# Patient Record
Sex: Female | Born: 1995 | Race: Black or African American | Hispanic: No | Marital: Single | Smoking: Never smoker
Health system: Southern US, Community
[De-identification: ages and names within clinical notes are randomized; demographics above are authoritative.]

## PROBLEM LIST (undated history)

## (undated) DIAGNOSIS — J45909 Unspecified asthma, uncomplicated: Secondary | ICD-10-CM

## (undated) DIAGNOSIS — R000 Tachycardia, unspecified: Secondary | ICD-10-CM

## (undated) DIAGNOSIS — J069 Acute upper respiratory infection, unspecified: Secondary | ICD-10-CM

## (undated) DIAGNOSIS — L309 Dermatitis, unspecified: Secondary | ICD-10-CM

## (undated) DIAGNOSIS — L509 Urticaria, unspecified: Secondary | ICD-10-CM

## (undated) HISTORY — DX: Acute upper respiratory infection, unspecified: J06.9

## (undated) HISTORY — PX: NO PAST SURGERIES: SHX2092

## (undated) HISTORY — DX: Dermatitis, unspecified: L30.9

## (undated) HISTORY — DX: Urticaria, unspecified: L50.9

---

## 2016-10-22 ENCOUNTER — Encounter (HOSPITAL_COMMUNITY): Payer: Self-pay | Admitting: Nurse Practitioner

## 2016-10-22 ENCOUNTER — Emergency Department (HOSPITAL_COMMUNITY): Payer: BC Managed Care – PPO

## 2016-10-22 ENCOUNTER — Emergency Department (HOSPITAL_COMMUNITY)
Admission: EM | Admit: 2016-10-22 | Discharge: 2016-10-23 | Disposition: A | Discharge status: Home / Self Care | Payer: BC Managed Care – PPO | Attending: Emergency Medicine | Admitting: Emergency Medicine

## 2016-10-22 DIAGNOSIS — R0600 Dyspnea, unspecified: Secondary | ICD-10-CM | POA: Diagnosis not present

## 2016-10-22 DIAGNOSIS — J45909 Unspecified asthma, uncomplicated: Secondary | ICD-10-CM | POA: Diagnosis not present

## 2016-10-22 DIAGNOSIS — R0602 Shortness of breath: Secondary | ICD-10-CM

## 2016-10-22 DIAGNOSIS — R079 Chest pain, unspecified: Secondary | ICD-10-CM | POA: Diagnosis not present

## 2016-10-22 HISTORY — DX: Unspecified asthma, uncomplicated: J45.909

## 2016-10-22 LAB — I-STAT CHEM 8, ED
BUN: 8 mg/dL (ref 6–20)
CREATININE: 0.8 mg/dL (ref 0.44–1.00)
Calcium, Ion: 1.16 mmol/L (ref 1.15–1.40)
Chloride: 107 mmol/L (ref 101–111)
GLUCOSE: 120 mg/dL — AB (ref 65–99)
HCT: 41 % (ref 36.0–46.0)
HEMOGLOBIN: 13.9 g/dL (ref 12.0–15.0)
POTASSIUM: 2.7 mmol/L — AB (ref 3.5–5.1)
Sodium: 142 mmol/L (ref 135–145)
TCO2: 19 mmol/L (ref 0–100)

## 2016-10-22 LAB — I-STAT BETA HCG BLOOD, ED (MC, WL, AP ONLY): I-stat hCG, quantitative: <5 m[IU]/mL (ref <5)

## 2016-10-22 MED ORDER — PREDNISONE 20 MG PO TABS
60.0000 mg | ORAL_TABLET | Freq: Once | ORAL | Status: AC
  Administered 2016-10-22: 60 mg via ORAL
  Filled 2016-10-22: qty 3

## 2016-10-22 MED ORDER — ALBUTEROL SULFATE (2.5 MG/3ML) 0.083% IN NEBU
5.0000 mg | INHALATION_SOLUTION | Freq: Once | RESPIRATORY_TRACT | Status: AC
  Administered 2016-10-22: 5 mg via RESPIRATORY_TRACT

## 2016-10-22 MED ORDER — FENTANYL CITRATE (PF) 100 MCG/2ML IJ SOLN
50.0000 ug | Freq: Once | INTRAMUSCULAR | Status: AC
  Administered 2016-10-22: 50 ug via INTRAVENOUS
  Filled 2016-10-22: qty 2

## 2016-10-22 NOTE — ED Provider Notes (Signed)
[facility]-EMERGENCY DEPT Provider Note   CSN: [id number] Arrival date & time: 10/22/16  2325   By signing my name below, I, Teofilo Pod, attest that this documentation has been prepared under the direction and in the presence of Zadie Rhine, MD . Electronically Signed: Teofilo Pod, ED Scribe. 10/22/2016. 11:54 PM.   History   Chief Complaint Chief Complaint  Patient presents with  . Shortness of Breath    The history is provided by the patient. No language interpreter was used.  Shortness of Breath  This is a new problem. The average episode lasts 1 hour. The problem occurs continuously.The current episode started less than 1 hour ago. The problem has not changed since onset.Associated symptoms include cough and chest pain. Pertinent negatives include no fever. It is unknown what precipitated the problem. Treatments tried: Albuterol inhaler. The treatment provided mild relief. Associated medical issues include asthma.   HPI Comments:  Kylie Powers is a 21 y.o. female who presents to the Emergency Department, here due to an asthma exacerbation that began 45 minutes PTA. Pt reports that she was driving in her car and went home and she began having chest tightness and SOB suddenly. Pt describes the chest pain as "like a weight on her chest." She states that the pain is worse when laying down. Pt complains of associated cough, and diaphoresis. Pt states that she has never had a similar asthma exacerbation before. Pt was using her albuterol inhaler but has run out. Pt was given a breathing treatment at the ED with significant relief. She states that she was otherwise healthy before the episode. Pt takes the birth control pill, does not smoke, and denies hx of cardiac issues. Pt denies fever.   Past Medical History:  Diagnosis Date  . Asthma     There are no active problems to display for this patient.   History reviewed. No pertinent surgical history.  OB History    Gravida Para Term Preterm AB Living   0 0 0 0 0 0   SAB TAB Ectopic Multiple Live Births   0 0 0 0 0       Home Medications    Prior to Admission medications   Medication Sig Start Date End Date Taking? Authorizing Provider  ASHLYNA 0.15-0.03 &0.01 MG tablet Take 1 tablet by mouth daily. 09/07/16  Yes Historical Provider, MD  TAZORAC 0.05 % gel Apply 1 application topically at bedtime. 10/03/16  Yes Historical Provider, MD    Family History History reviewed. No pertinent family history.  Social History Social History  Substance Use Topics  . Smoking status: Never Smoker  . Smokeless tobacco: Never Used  . Alcohol use No     Allergies   Sulfa antibiotics   Review of Systems Review of Systems  Constitutional: Negative for fever.  Respiratory: Positive for cough and shortness of breath.   Cardiovascular: Positive for chest pain.  All other systems reviewed and are negative.    Physical Exam Updated Vital Signs BP (!) 150/101 (BP Location: Left Arm)   Pulse (!) 142   Temp 98.4 F (36.9 C) (Oral)   Resp 19   Ht 5\' 2"  (1.575 m)   Wt 155 lb (70.3 kg)   SpO2 100%   BMI 28.35 kg/m   Physical Exam CONSTITUTIONAL: Well developed/well nourished; anxious HEAD: Normocephalic/atraumatic EYES: EOMI/PERRL ENMT: Mucous membranes moist; uvula midline no erythema or exudates NECK: supple no meningeal signs SPINE/BACK:entire spine nontender CV: S1/S2 noted, no murmurs/rubs/gallops noted;  tachycardic  LUNGS: tachypnea and deceased breath sounds ABDOMEN: soft, nontender, no rebound or guarding, bowel sounds noted throughout abdomen GU:no cva tenderness NEURO: Pt is awake/alert/appropriate, moves all extremitiesx4.  No facial droop.   EXTREMITIES: pulses normal/equal, full ROM SKIN: warm, color normal PSYCH: Anxious   ED Treatments / Results  DIAGNOSTIC STUDIES:  Oxygen Saturation is 100% on RA, normal by my interpretation.    COORDINATION OF CARE:  11:54 PM  Discussed treatment plan with pt at bedside and pt agreed to plan.   Labs (all labs ordered are listed, but only abnormal results are displayed) Labs Reviewed  D-DIMER, QUANTITATIVE (NOT AT [facility]) - Abnormal; Notable for the following:       Result Value   D-Dimer, Quant 0.62 (*)    All other components within normal limits  I-STAT CHEM 8, ED - Abnormal; Notable for the following:    Potassium 2.7 (*)    Glucose, Bld 120 (*)    All other components within normal limits  TROPONIN I  I-STAT BETA HCG BLOOD, ED ([facility], [facility], [facility] ONLY)    EKG  EKG Interpretation  Date/Time:  Sunday October 22 2016 23:30:42 EST Ventricular Rate:  142 PR Interval:    QRS Duration: 92 QT Interval:  296 QTC Calculation: 455 R Axis:   97 Text Interpretation:  narrow complex tachycardia Sinus tachycardia Baseline wander Artifact Abnormal ekg Confirmed by Gerhard MunchLOCKWOOD, ROBERT  MD ([id number]) on 10/22/2016 11:47:34 PM       Radiology Dg Chest 2 View  Result Date: 10/23/2016 CLINICAL DATA:  Shortness of breath. EXAM: CHEST  2 VIEW COMPARISON:  None. FINDINGS: The cardiomediastinal contours are normal. Mild bronchial thickening. Pulmonary vasculature is normal. No consolidation, pleural effusion, or pneumothorax. No acute osseous abnormalities are seen. Mild scoliosis of the thoracolumbar spine. IMPRESSION: Mild bronchial thickening, may reflect bronchitis or asthma. Electronically Signed   By: Rubye OaksMelanie  Ehinger M.D.   On: 10/23/2016 00:32   Ct Angio Chest Pe W And/or Wo Contrast  Result Date: 10/23/2016 CLINICAL DATA:  Chest tightness and dyspnea, positional. EXAM: CT ANGIOGRAPHY CHEST WITH CONTRAST TECHNIQUE: Multidetector CT imaging of the chest was performed using the standard protocol during bolus administration of intravenous contrast. Multiplanar CT image reconstructions and MIPs were obtained to evaluate the vascular anatomy. CONTRAST:  100 mL Isovue 370 intravenous. COMPARISON:  None. FINDINGS: Cardiovascular:  Satisfactory opacification of the pulmonary arteries to the segmental level. No evidence of pulmonary embolism. Normal heart size. No pericardial effusion. Mediastinum/Nodes: No enlarged mediastinal, hilar, or axillary lymph nodes. Thyroid gland, trachea, and esophagus demonstrate no significant findings. Lungs/Pleura: Lungs are clear. No pleural effusion or pneumothorax. Upper Abdomen: No acute abnormality. Musculoskeletal: No chest wall abnormality. No acute or significant osseous findings. Review of the MIP images confirms the above findings. IMPRESSION: Negative for acute pulmonary embolism.  No significant abnormality. Electronically Signed   By: Ellery Plunkaniel R Mitchell M.D.   On: 10/23/2016 03:32    Procedures Procedures (including critical care time)  Medications Ordered in ED Medications  iopamidol (ISOVUE-370) 76 % injection (not administered)  sodium chloride 0.9 % injection (not administered)  albuterol (PROVENTIL) (2.5 MG/3ML) 0.083% nebulizer solution 5 mg (5 mg Nebulization Given 10/22/16 2342)  predniSONE (DELTASONE) tablet 60 mg (60 mg Oral Given 10/22/16 2353)  fentaNYL (SUBLIMAZE) injection 50 mcg (50 mcg Intravenous Given 10/22/16 2358)  potassium chloride SA (K-DUR,KLOR-CON) CR tablet 40 mEq (40 mEq Oral Given 10/23/16 0029)  fentaNYL (SUBLIMAZE) injection 50 mcg (50 mcg Intravenous Given 10/23/16  1610)  potassium chloride SA (K-DUR,KLOR-CON) CR tablet 40 mEq (40 mEq Oral Given 10/23/16 0158)  ketorolac (TORADOL) 15 MG/ML injection 15 mg (15 mg Intravenous Given 10/23/16 0300)  iopamidol (ISOVUE-370) 76 % injection 100 mL (100 mLs Intravenous Contrast Given 10/23/16 0305)  sodium chloride 0.9 % bolus 1,000 mL (0 mLs Intravenous Stopped 10/23/16 0541)     Initial Impression / Assessment and Plan / ED Course  I have reviewed the triage vital signs and the nursing notes.  Pertinent labs & imaging results that were available during my care of the patient were reviewed by me and considered  in my medical decision making (see chart for details).     5:59 AM Pt seen for CP/SOB Sh was initially tachycardic/tachypneic/ill appearing Extensive workup unrevealing as I was concerned for PE Pt improved HR 114 She is well appearing/watching TV I doubt ACS No signs of pericarditis/myocarditis Will d/c home We discussed return precautions  Final Clinical Impressions(s) / ED Diagnoses   Final diagnoses:  Chest pain, unspecified type  Dyspnea, unspecified type    New Prescriptions New Prescriptions   ALBUTEROL (PROVENTIL HFA;VENTOLIN HFA) 108 (90 BASE) MCG/ACT INHALER    Inhale 2 puffs into the lungs every 2 (two) hours as needed for wheezing or shortness of breath (cough).  I personally performed the services described in this documentation, which was scribed in my presence. The recorded information has been reviewed and is accurate.        Zadie Rhine, MD 10/23/16 [phone]

## 2016-10-22 NOTE — ED Triage Notes (Signed)
Patient states she started having an asthma attach about 45 min ago. States she has been using her albuterol inhaler but is out. HR is 148. She has labored breathing. Diaphoretic. Inhaler that she has with her is empty.

## 2016-10-23 ENCOUNTER — Emergency Department (HOSPITAL_COMMUNITY): Payer: BC Managed Care – PPO

## 2016-10-23 ENCOUNTER — Encounter (HOSPITAL_COMMUNITY): Payer: Self-pay

## 2016-10-23 LAB — TROPONIN I

## 2016-10-23 LAB — D-DIMER, QUANTITATIVE: D-Dimer, Quant: 0.62 ug/mL-FEU — ABNORMAL HIGH (ref 0.00–0.50)

## 2016-10-23 MED ORDER — SODIUM CHLORIDE 0.9 % IV BOLUS (SEPSIS)
1000.0000 mL | Freq: Once | INTRAVENOUS | Status: AC
  Administered 2016-10-23: 1000 mL via INTRAVENOUS

## 2016-10-23 MED ORDER — IOPAMIDOL (ISOVUE-370) INJECTION 76%
100.0000 mL | Freq: Once | INTRAVENOUS | Status: AC | PRN
  Administered 2016-10-23: 100 mL via INTRAVENOUS

## 2016-10-23 MED ORDER — FENTANYL CITRATE (PF) 100 MCG/2ML IJ SOLN
50.0000 ug | Freq: Once | INTRAMUSCULAR | Status: AC
  Administered 2016-10-23: 50 ug via INTRAVENOUS
  Filled 2016-10-23: qty 2

## 2016-10-23 MED ORDER — KETOROLAC TROMETHAMINE 15 MG/ML IJ SOLN
15.0000 mg | Freq: Once | INTRAMUSCULAR | Status: AC
  Administered 2016-10-23: 15 mg via INTRAVENOUS
  Filled 2016-10-23: qty 1

## 2016-10-23 MED ORDER — IOPAMIDOL (ISOVUE-370) INJECTION 76%
INTRAVENOUS | Status: AC
  Filled 2016-10-23: qty 100

## 2016-10-23 MED ORDER — ALBUTEROL SULFATE HFA 108 (90 BASE) MCG/ACT IN AERS: 2.0000 | INHALATION_SPRAY | RESPIRATORY_TRACT | 0 refills | Status: DC | PRN

## 2016-10-23 MED ORDER — SODIUM CHLORIDE 0.9 % IJ SOLN
INTRAMUSCULAR | Status: AC
  Filled 2016-10-23: qty 50

## 2016-10-23 MED ORDER — POTASSIUM CHLORIDE CRYS ER 20 MEQ PO TBCR
40.0000 meq | EXTENDED_RELEASE_TABLET | Freq: Once | ORAL | Status: AC
  Administered 2016-10-23: 40 meq via ORAL
  Filled 2016-10-23: qty 2

## 2016-10-23 NOTE — ED Notes (Signed)
Patient transported to CT 

## 2016-10-23 NOTE — Discharge Instructions (Signed)

## 2016-10-23 NOTE — ED Notes (Signed)
Patient appears to be in less distress. Talking and laughing with friend. No use of accessory muscle. Breathing is more relaxed.

## 2016-10-26 ENCOUNTER — Telehealth: Payer: Self-pay

## 2016-10-26 NOTE — Telephone Encounter (Signed)
Spoke with the nurse for [facility], I saw that MW had a 30 min. Slot on Feb 15, She will discuss with [facility] if that appointment will work and then contact us back.

## 2016-11-14 ENCOUNTER — Ambulatory Visit (HOSPITAL_COMMUNITY)
Admission: EM | Admit: 2016-11-14 | Discharge: 2016-11-14 | Disposition: A | Discharge status: Home / Self Care | Payer: BC Managed Care – PPO | Attending: Family Medicine | Admitting: Family Medicine

## 2016-11-14 ENCOUNTER — Encounter (HOSPITAL_COMMUNITY): Payer: Self-pay | Admitting: Family Medicine

## 2016-11-14 DIAGNOSIS — R0789 Other chest pain: Secondary | ICD-10-CM

## 2016-11-14 MED ORDER — DICLOFENAC POTASSIUM 50 MG PO TABS: 50.0000 mg | ORAL_TABLET | Freq: Three times a day (TID) | ORAL | 1 refills | Status: DC

## 2016-11-14 NOTE — ED Triage Notes (Signed)
Pt here with heaviness in chest that has been going on for weeks. sts she has been evaluated but today she came in because BP was low at home. Diastolic was low.

## 2016-11-14 NOTE — ED Provider Notes (Addendum)
[facility]    CSN: [id number] Arrival date & time: 11/14/16  1659     History   Chief Complaint Chief Complaint  Patient presents with  . Chest Pain    HPI Kylie Powers is a 21 y.o. female.   The history is provided by the patient and a parent.  Chest Pain  Pain location:  L chest Pain quality: sharp   Pain radiates to:  Does not radiate Pain severity:  Mild Onset quality:  Gradual Duration:  2 weeks Timing:  Intermittent Progression:  Unchanged Chronicity:  New Context: movement   Relieved by:  None tried Worsened by:  Nothing Ineffective treatments:  None tried Associated symptoms: no abdominal pain, no back pain, no fever, no palpitations, no PND and no shortness of breath     Past Medical History:  Diagnosis Date  . Asthma     There are no active problems to display for this patient.   History reviewed. No pertinent surgical history.  OB History    Gravida Para Term Preterm AB Living   0 0 0 0 0 0   SAB TAB Ectopic Multiple Live Births   0 0 0 0 0       Home Medications    Prior to Admission medications   Medication Sig Start Date End Date Taking? Authorizing Provider  albuterol (PROVENTIL HFA;VENTOLIN HFA) 108 (90 Base) MCG/ACT inhaler Inhale 2 puffs into the lungs every 2 (two) hours as needed for wheezing or shortness of breath (cough). 10/23/16   Zadie Rhineonald Wickline, MD  ASHLYNA 0.15-0.03 &0.01 MG tablet Take 1 tablet by mouth daily. 09/07/16   Historical Provider, MD  diclofenac (CATAFLAM) 50 MG tablet Take 1 tablet (50 mg total) by mouth 3 (three) times daily. 11/14/16   Linna HoffJames D Tamakia Porto, MD  TAZORAC 0.05 % gel Apply 1 application topically at bedtime. 10/03/16   Historical Provider, MD    Family History History reviewed. No pertinent family history.  Social History Social History  Substance Use Topics  . Smoking status: Never Smoker  . Smokeless tobacco: Never Used  . Alcohol use No     Allergies   Sulfa  antibiotics   Review of Systems Review of Systems  Constitutional: Negative.  Negative for fever.  HENT: Negative.   Respiratory: Negative.  Negative for shortness of breath.   Cardiovascular: Positive for chest pain. Negative for palpitations, leg swelling and PND.  Gastrointestinal: Negative.  Negative for abdominal pain.  Musculoskeletal: Negative for back pain.     Physical Exam Triage Vital Signs ED Triage Vitals  Enc Vitals Group     BP 11/14/16 1716 130/81     Pulse Rate 11/14/16 1716 84     Resp 11/14/16 1716 18     Temp 11/14/16 1716 98.8 F (37.1 C)     Temp src --      SpO2 11/14/16 1716 100 %     Weight --      Height --      Head Circumference --      Peak Flow --      Pain Score 11/14/16 1718 6     Pain Loc --      Pain Edu? --      Excl. in GC? --    No data found.   Updated Vital Signs BP 130/81   Pulse 84   Temp 98.8 F (37.1 C)   Resp 18   LMP 10/14/2016   SpO2 100%  Visual Acuity Right Eye Distance:   Left Eye Distance:   Bilateral Distance:    Right Eye Near:   Left Eye Near:    Bilateral Near:     Physical Exam  Constitutional: She is oriented to person, place, and time. She appears well-developed and well-nourished.  Eyes: Conjunctivae are normal. Pupils are equal, round, and reactive to light.  Neck: Normal range of motion. Neck supple.  Cardiovascular: Normal rate, regular rhythm, normal heart sounds and intact distal pulses.   Pulmonary/Chest: Effort normal and breath sounds normal. She exhibits tenderness.  Neurological: She is alert and oriented to person, place, and time.     UC Treatments / Results  Labs (all labs ordered are listed, but only abnormal results are displayed) Labs Reviewed - No data to display  EKG  EKG Interpretation None       Radiology No results found.  Procedures Procedures (including critical care time)  Medications Ordered in UC Medications - No data to display   Initial  Impression / Assessment and Plan / UC Course  I have reviewed the triage vital signs and the nursing notes.  Pertinent labs & imaging results that were available during my care of the patient were reviewed by me and considered in my medical decision making (see chart for details).       Final Clinical Impressions(s) / UC Diagnoses   Final diagnoses:  Chest wall pain    New Prescriptions Discharge Medication List as of 11/14/2016  6:42 PM    START taking these medications   Details  diclofenac (CATAFLAM) 50 MG tablet Take 1 tablet (50 mg total) by mouth 3 (three) times daily., Starting Tue 11/14/2016, Print         Linna Hoff, MD 11/14/16 1610    Linna Hoff, MD 11/14/16 2104

## 2016-11-14 NOTE — Discharge Instructions (Signed)
Use medicine as needed, see your doctor as needed. °

## 2017-08-21 ENCOUNTER — Encounter (HOSPITAL_BASED_OUTPATIENT_CLINIC_OR_DEPARTMENT_OTHER): Payer: Self-pay

## 2017-08-21 ENCOUNTER — Other Ambulatory Visit: Payer: Self-pay

## 2017-08-21 ENCOUNTER — Emergency Department (HOSPITAL_BASED_OUTPATIENT_CLINIC_OR_DEPARTMENT_OTHER): Payer: BC Managed Care – PPO

## 2017-08-21 ENCOUNTER — Emergency Department (HOSPITAL_BASED_OUTPATIENT_CLINIC_OR_DEPARTMENT_OTHER)
Admission: EM | Admit: 2017-08-21 | Discharge: 2017-08-22 | Disposition: A | Discharge status: Home / Self Care | Payer: BC Managed Care – PPO | Attending: Emergency Medicine | Admitting: Emergency Medicine

## 2017-08-21 DIAGNOSIS — Z79899 Other long term (current) drug therapy: Secondary | ICD-10-CM | POA: Diagnosis not present

## 2017-08-21 DIAGNOSIS — I880 Nonspecific mesenteric lymphadenitis: Secondary | ICD-10-CM | POA: Diagnosis not present

## 2017-08-21 DIAGNOSIS — R1110 Vomiting, unspecified: Secondary | ICD-10-CM | POA: Diagnosis present

## 2017-08-21 DIAGNOSIS — R112 Nausea with vomiting, unspecified: Secondary | ICD-10-CM

## 2017-08-21 DIAGNOSIS — R197 Diarrhea, unspecified: Secondary | ICD-10-CM

## 2017-08-21 DIAGNOSIS — J45909 Unspecified asthma, uncomplicated: Secondary | ICD-10-CM | POA: Insufficient documentation

## 2017-08-21 DIAGNOSIS — R1031 Right lower quadrant pain: Secondary | ICD-10-CM | POA: Insufficient documentation

## 2017-08-21 HISTORY — DX: Tachycardia, unspecified: R00.0

## 2017-08-21 LAB — URINALYSIS, ROUTINE W REFLEX MICROSCOPIC
Bilirubin Urine: NEGATIVE
GLUCOSE, UA: NEGATIVE mg/dL
Ketones, ur: NEGATIVE mg/dL
LEUKOCYTES UA: NEGATIVE
NITRITE: NEGATIVE
PH: 6 (ref 5.0–8.0)
Protein, ur: NEGATIVE mg/dL
SPECIFIC GRAVITY, URINE: 1.015 (ref 1.005–1.030)

## 2017-08-21 LAB — CBC
HEMATOCRIT: 39 % (ref 36.0–46.0)
HEMOGLOBIN: 13.3 g/dL (ref 12.0–15.0)
MCH: 25.3 pg — AB (ref 26.0–34.0)
MCHC: 34.1 g/dL (ref 30.0–36.0)
MCV: 74.3 fL — ABNORMAL LOW (ref 78.0–100.0)
Platelets: 257 10*3/uL (ref 150–400)
RBC: 5.25 MIL/uL — ABNORMAL HIGH (ref 3.87–5.11)
RDW: 13.9 % (ref 11.5–15.5)
WBC: 6.4 10*3/uL (ref 4.0–10.5)

## 2017-08-21 LAB — COMPREHENSIVE METABOLIC PANEL
ALBUMIN: 4.2 g/dL (ref 3.5–5.0)
ALK PHOS: 46 U/L (ref 38–126)
ALT: 19 U/L (ref 14–54)
ANION GAP: 6 (ref 5–15)
AST: 20 U/L (ref 15–41)
BUN: 6 mg/dL (ref 6–20)
CALCIUM: 8.4 mg/dL — AB (ref 8.9–10.3)
CO2: 24 mmol/L (ref 22–32)
Chloride: 106 mmol/L (ref 101–111)
Creatinine, Ser: 0.82 mg/dL (ref 0.44–1.00)
GFR calc Af Amer: >60 mL/min (ref >60)
GFR calc non Af Amer: >60 mL/min (ref >60)
GLUCOSE: 90 mg/dL (ref 65–99)
POTASSIUM: 3.5 mmol/L (ref 3.5–5.1)
SODIUM: 136 mmol/L (ref 135–145)
Total Bilirubin: 0.3 mg/dL (ref 0.3–1.2)
Total Protein: 7.1 g/dL (ref 6.5–8.1)

## 2017-08-21 LAB — URINALYSIS, MICROSCOPIC (REFLEX)

## 2017-08-21 LAB — LIPASE, BLOOD: Lipase: 27 U/L (ref 11–51)

## 2017-08-21 LAB — PREGNANCY, URINE: Preg Test, Ur: NEGATIVE

## 2017-08-21 MED ORDER — MORPHINE SULFATE (PF) 4 MG/ML IV SOLN
8.0000 mg | Freq: Once | INTRAVENOUS | Status: AC
  Administered 2017-08-21: 8 mg via INTRAVENOUS
  Filled 2017-08-21: qty 2

## 2017-08-21 MED ORDER — ONDANSETRON HCL 4 MG/2ML IJ SOLN
INTRAMUSCULAR | Status: AC
  Administered 2017-08-21: 4 mg via INTRAVENOUS
  Filled 2017-08-21: qty 2

## 2017-08-21 MED ORDER — IOPAMIDOL (ISOVUE-300) INJECTION 61%
100.0000 mL | Freq: Once | INTRAVENOUS | Status: AC | PRN
  Administered 2017-08-22: 100 mL via INTRAVENOUS

## 2017-08-21 MED ORDER — FENTANYL CITRATE (PF) 100 MCG/2ML IJ SOLN
50.0000 ug | Freq: Once | INTRAMUSCULAR | Status: AC
  Administered 2017-08-21: 50 ug via INTRAVENOUS
  Filled 2017-08-21: qty 2

## 2017-08-21 MED ORDER — SODIUM CHLORIDE 0.9 % IV BOLUS (SEPSIS)
1000.0000 mL | Freq: Once | INTRAVENOUS | Status: AC
  Administered 2017-08-21: 1000 mL via INTRAVENOUS

## 2017-08-21 MED ORDER — ONDANSETRON HCL 4 MG/2ML IJ SOLN
4.0000 mg | Freq: Once | INTRAMUSCULAR | Status: AC
  Administered 2017-08-21: 4 mg via INTRAVENOUS

## 2017-08-21 NOTE — ED Provider Notes (Signed)
[facility] EMERGENCY DEPARTMENT Provider Note   CSN: [id number] Arrival date & time: 08/21/17  2038     History   Chief Complaint Chief Complaint  Patient presents with  . Emesis    HPI Kylie Powers is a 21 y.o. female.  HPI  21 year old female presents with vomiting, abdominal pain, and diarrhea.  She has had on and off vomiting for the last 2 weeks.  However since 2 days ago the vomiting is more consistent and she is also having a lot of diarrhea anytime she tries to eat.  Both of these are nonbloody and the diarrhea is watery.  No recent travel outside the country or antibiotics.  No fevers.  The abdominal pain is right lower abdomen.  It feels like a cramping and is constant and worsening.  No urinary symptoms or vaginal symptoms.  Pain is an 8/10.  Past Medical History:  Diagnosis Date  . Asthma   . Sinus tachycardia     There are no active problems to display for this patient.   History reviewed. No pertinent surgical history.  OB History    Gravida Para Term Preterm AB Living   0 0 0 0 0 0   SAB TAB Ectopic Multiple Live Births   0 0 0 0 0       Home Medications    Prior to Admission medications   Medication Sig Start Date End Date Taking? Authorizing Provider  promethazine (PHENERGAN) 25 MG tablet Take 25 mg by mouth every 6 (six) hours as needed for nausea or vomiting.   Yes [provider]  albuterol (PROVENTIL HFA;VENTOLIN HFA) 108 (90 Base) MCG/ACT inhaler Inhale 2 puffs into the lungs every 2 (two) hours as needed for wheezing or shortness of breath (cough). 10/23/16   Zadie RhineWickline, Donald, MD    Family History No family history on file.  Social History Social History   Tobacco Use  . Smoking status: Never Smoker  . Smokeless tobacco: Never Used  Substance Use Topics  . Alcohol use: No  . Drug use: No     Allergies   Sulfa antibiotics   Review of Systems Review of Systems  Respiratory: Negative for shortness of  breath.   Cardiovascular: Negative for chest pain.  Gastrointestinal: Positive for abdominal pain, diarrhea, nausea and vomiting. Negative for blood in stool.  Genitourinary: Negative for dysuria and hematuria.  All other systems reviewed and are negative.    Physical Exam Updated Vital Signs BP 139/89   Pulse 100   Temp 97.6 F (36.4 C) (Oral)   Resp 18   Ht 5\' 3"  (1.6 m)   Wt 80.3 kg (177 lb 0.5 oz)   LMP 08/07/2017   SpO2 99%   BMI 31.36 kg/m   Physical Exam  Constitutional: She is oriented to person, place, and time. She appears well-developed and well-nourished. No distress.  HENT:  Head: Normocephalic and atraumatic.  Right Ear: External ear normal.  Left Ear: External ear normal.  Nose: Nose normal.  Eyes: Right eye exhibits no discharge. Left eye exhibits no discharge.  Cardiovascular: Regular rhythm and normal heart sounds. Tachycardia present.  Pulmonary/Chest: Effort normal and breath sounds normal. She has no wheezes.  Abdominal: Soft. There is tenderness in the right lower quadrant.  Neurological: She is alert and oriented to person, place, and time.  Skin: Skin is warm and dry. She is not diaphoretic.  Nursing note and vitals reviewed.    ED Treatments / Results  Labs (  all labs ordered are listed, but only abnormal results are displayed) Labs Reviewed  URINALYSIS, ROUTINE W REFLEX MICROSCOPIC - Abnormal; Notable for the following components:      Result Value   Hgb urine dipstick TRACE (*)    All other components within normal limits  URINALYSIS, MICROSCOPIC (REFLEX) - Abnormal; Notable for the following components:   Bacteria, UA MANY (*)    Squamous Epithelial / LPF 0-5 (*)    All other components within normal limits  COMPREHENSIVE METABOLIC PANEL - Abnormal; Notable for the following components:   Calcium 8.4 (*)    All other components within normal limits  CBC - Abnormal; Notable for the following components:   RBC 5.25 (*)    MCV 74.3 (*)      MCH 25.3 (*)    All other components within normal limits  PREGNANCY, URINE  LIPASE, BLOOD    EKG  EKG Interpretation None       Radiology No results found.  Procedures Procedures (including critical care time)  Medications Ordered in ED Medications  iopamidol (ISOVUE-300) 61 % injection 100 mL (not administered)  ondansetron (ZOFRAN) injection 4 mg (4 mg Intravenous Given 08/21/17 2141)  fentaNYL (SUBLIMAZE) injection 50 mcg (50 mcg Intravenous Given 08/21/17 2222)  sodium chloride 0.9 % bolus 1,000 mL (0 mLs Intravenous Stopped 08/21/17 2329)  morphine 4 MG/ML injection 8 mg (8 mg Intravenous Given 08/21/17 2330)     Initial Impression / Assessment and Plan / ED Course  I have reviewed the triage vital signs and the nursing notes.  Pertinent labs & imaging results that were available during my care of the patient were reviewed by me and considered in my medical decision making (see chart for details).     Patient's symptoms are most c/w gastroenteritis. However with significant RLQ tenderness, will get CT to r/o appendicitis. CT pending, care transferred to Dr. Read DriversMolpus. Otherwise, if CT negative, could modify anti-emetic meds and d/c home w/ PCP f/u  Final Clinical Impressions(s) / ED Diagnoses   Final diagnoses:  None    ED Discharge Orders    None       Pricilla LovelessGoldston, Krisi Azua, MD 08/22/17 0002

## 2017-08-21 NOTE — ED Triage Notes (Signed)
C/o abd pain, n/v/d x 2 weeks-NAD-steady gait

## 2017-08-21 NOTE — ED Notes (Signed)
Pt states she has been taking phenergan

## 2017-08-22 MED ORDER — ONDANSETRON 8 MG PO TBDP: 8.0000 mg | ORAL_TABLET | Freq: Three times a day (TID) | ORAL | 1 refills | Status: DC | PRN

## 2017-08-22 NOTE — ED Notes (Signed)
Pt off the unit in CT  

## 2018-08-10 IMAGING — CT CT ANGIO CHEST
3 of 7 series · 19 of 36 positions shown · IV contrast (ISOVUE 370)
Comparison: None.

CLINICAL DATA: Chest tightness and dyspnea, positional.

EXAM:
CT ANGIOGRAPHY CHEST WITH CONTRAST
TECHNIQUE: Multidetector CT imaging of the chest was performed using the
standard protocol during bolus administration of intravenous
contrast. Multiplanar CT image reconstructions and MIPs were
obtained to evaluate the vascular anatomy.
CONTRAST:  100 mL Isovue 370 intravenous.

[Series 5: thins for pacs · axial · 0.56mm/px · z∈[+1424,+1608]mm · 15 of 212 slices shown]
[im 14/212  lung]
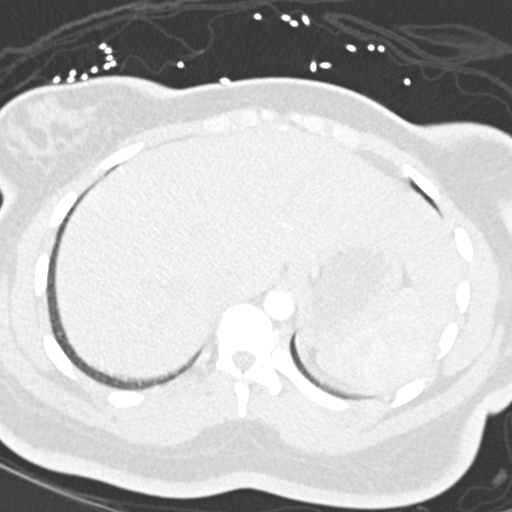
[im 27/212  mediastinal]
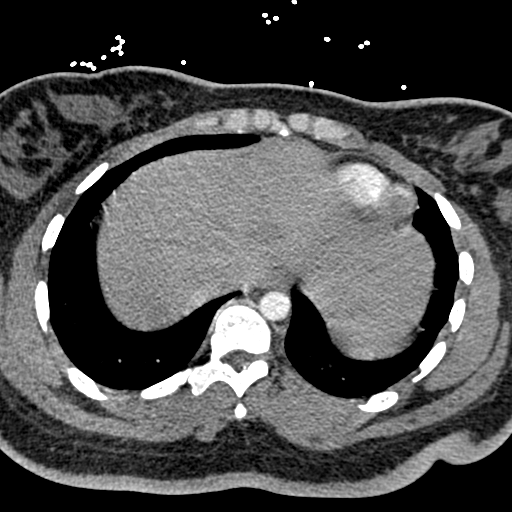
[im 40/212  lung]
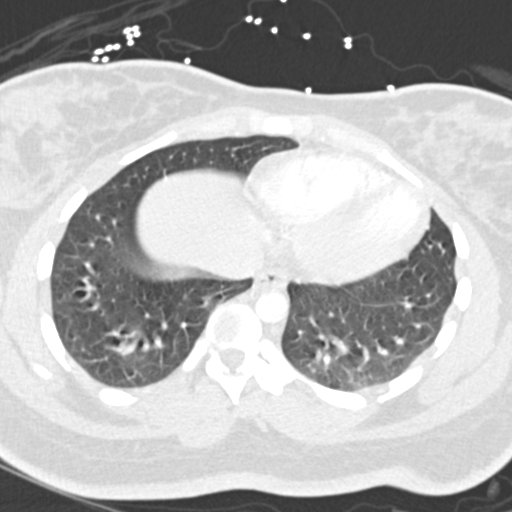
[im 53/212  mediastinal]
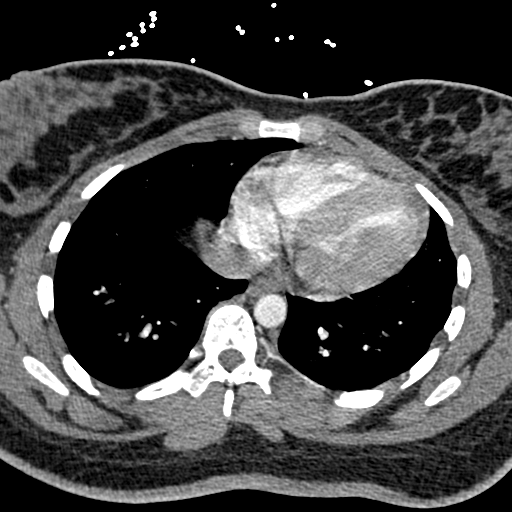
[im 66/212  lung]
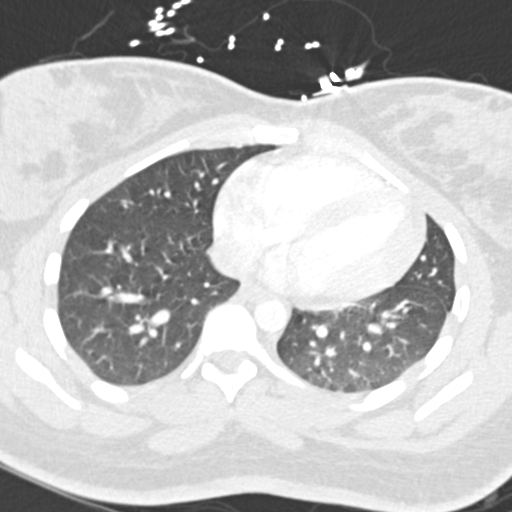
[im 80/212  mediastinal]
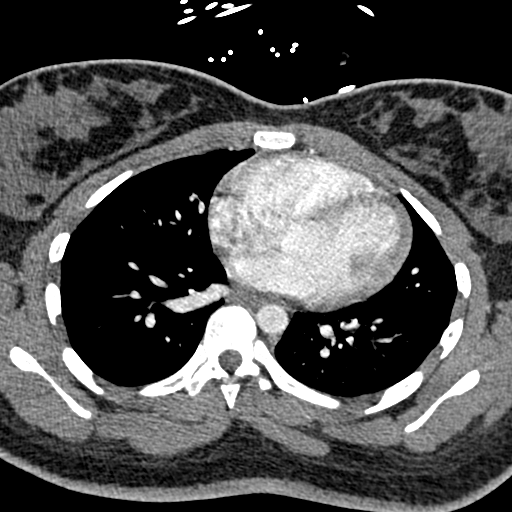
[im 93/212  lung]
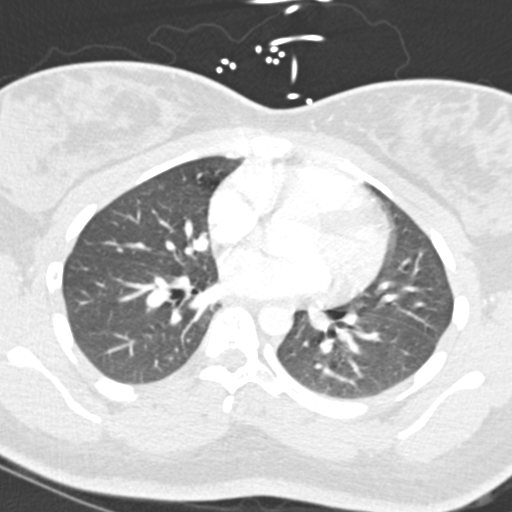
[im 106/212  mediastinal]
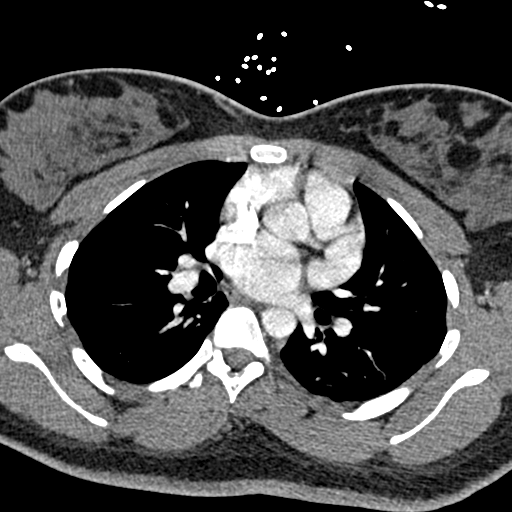
[im 119/212  lung]
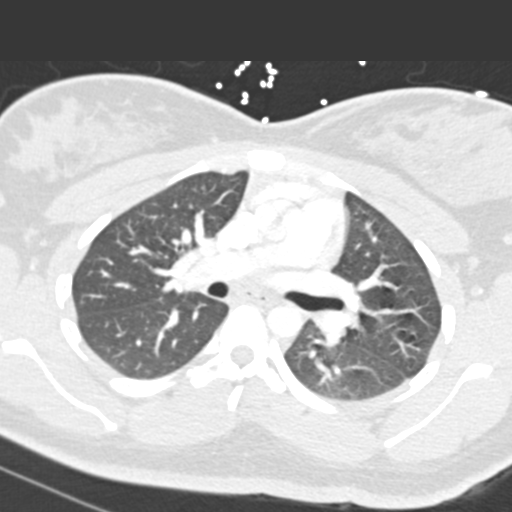
[im 132/212  mediastinal]
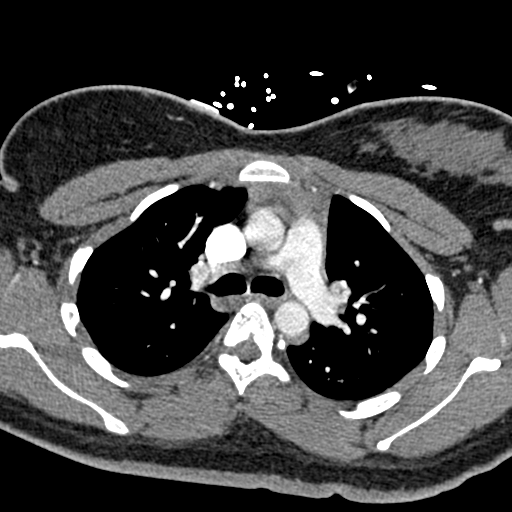
[im 146/212  lung]
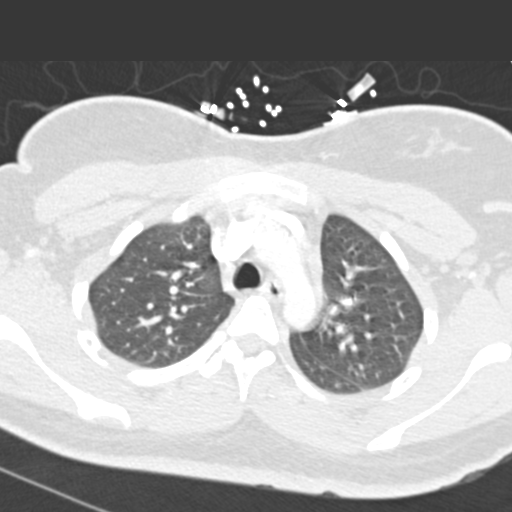
[im 159/212  mediastinal]
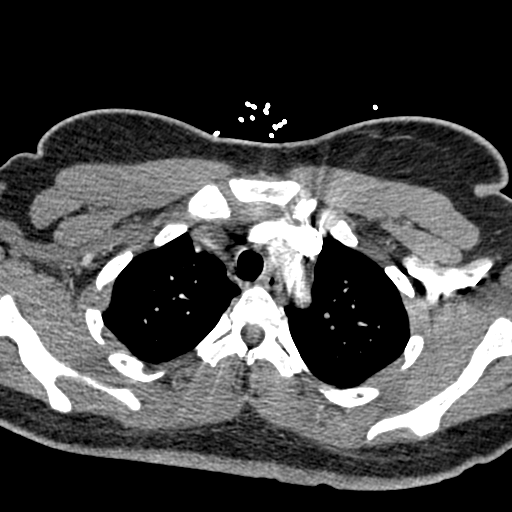
[im 172/212  lung]
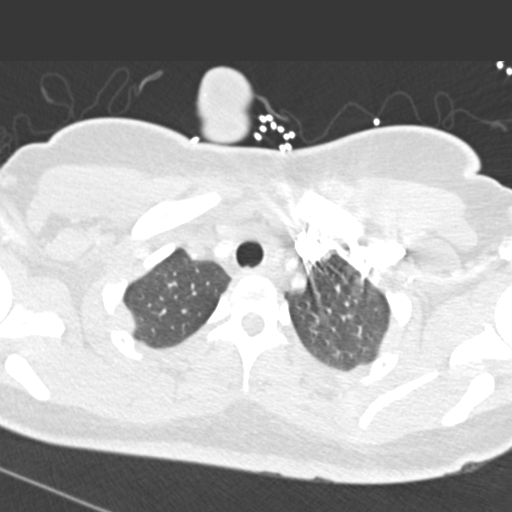
[im 185/212  mediastinal]
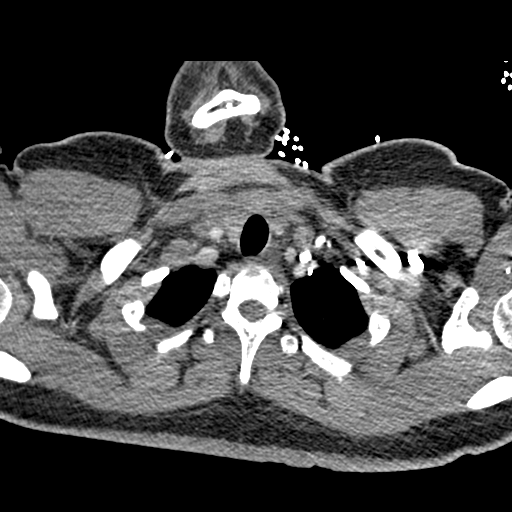
[im 198/212  lung]
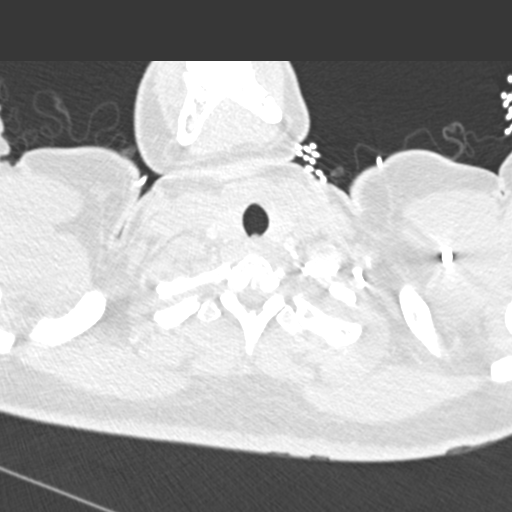

[Series 6: lung windows · axial · 0.56mm/px · z∈[+1468,+1570]mm · 3 of 69 slices shown]
[im 18/69  mediastinal]
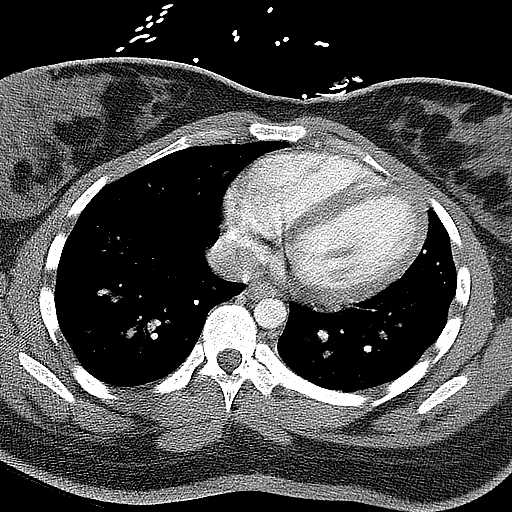
[im 35/69  mediastinal]
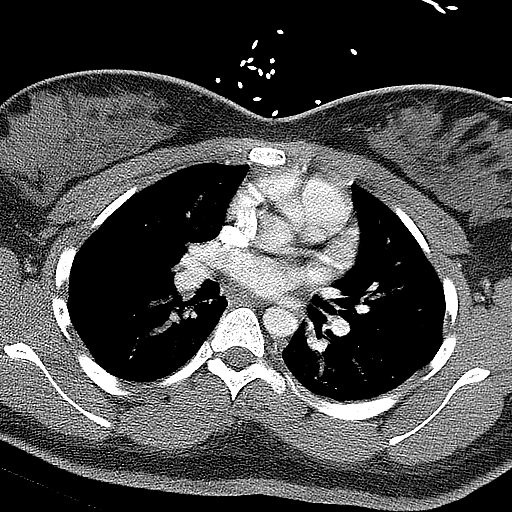
[im 52/69  mediastinal]
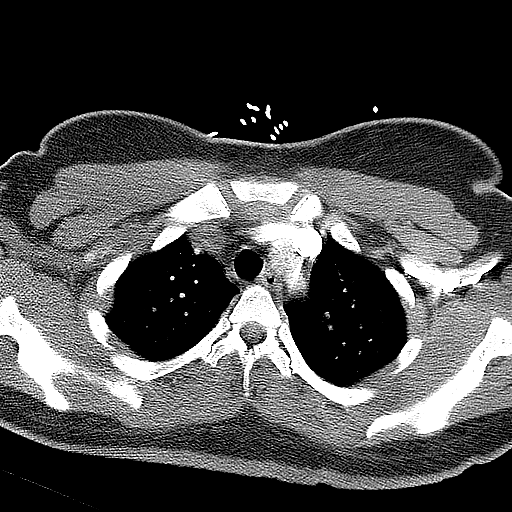

[Series 7: coronal mpr · coronal · 0.41mm/px · 1 of 106 slices shown]
[im 53/106  mediastinal]
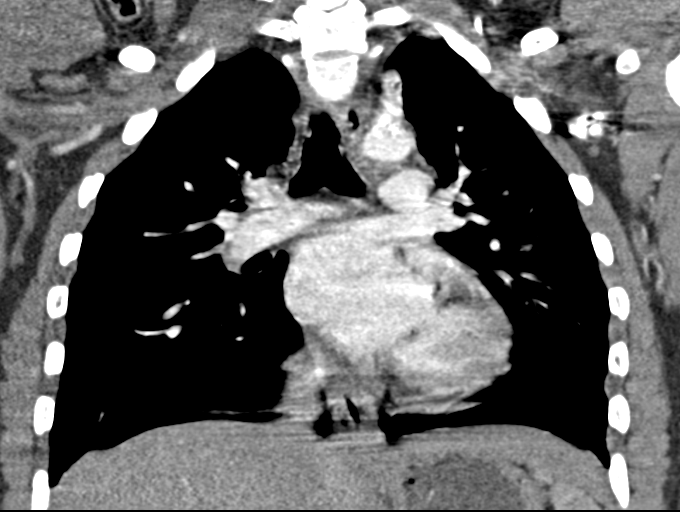

[19 of 36 positions shown; findings below may reference images not displayed]

FINDINGS: Cardiovascular: Satisfactory opacification of the pulmonary arteries
to the segmental level. No evidence of pulmonary embolism. Normal
heart size. No pericardial effusion.

Mediastinum/Nodes: No enlarged mediastinal, hilar, or axillary lymph
nodes. Thyroid gland, trachea, and esophagus demonstrate no
significant findings.

Lungs/Pleura: Lungs are clear. No pleural effusion or pneumothorax.

Upper Abdomen: No acute abnormality.

Musculoskeletal: No chest wall abnormality. No acute or significant
osseous findings.

Review of the MIP images confirms the above findings.
IMPRESSION: Negative for acute pulmonary embolism.  No significant abnormality.

## 2018-11-07 ENCOUNTER — Telehealth: Payer: Self-pay

## 2018-11-07 ENCOUNTER — Ambulatory Visit: Payer: BC Managed Care – PPO | Admitting: Allergy

## 2018-11-07 ENCOUNTER — Encounter: Payer: Self-pay | Admitting: Allergy

## 2018-11-07 VITALS — BP 122/74 | HR 80 | Temp 98.4°F | Resp 16 | Ht 63.0 in | Wt 169.0 lb

## 2018-11-07 DIAGNOSIS — T781XXD Other adverse food reactions, not elsewhere classified, subsequent encounter: Secondary | ICD-10-CM

## 2018-11-07 DIAGNOSIS — J4520 Mild intermittent asthma, uncomplicated: Secondary | ICD-10-CM | POA: Diagnosis not present

## 2018-11-07 DIAGNOSIS — L2089 Other atopic dermatitis: Secondary | ICD-10-CM

## 2018-11-07 DIAGNOSIS — H1013 Acute atopic conjunctivitis, bilateral: Secondary | ICD-10-CM | POA: Diagnosis not present

## 2018-11-07 DIAGNOSIS — J3089 Other allergic rhinitis: Secondary | ICD-10-CM | POA: Diagnosis not present

## 2018-11-07 DIAGNOSIS — T50905D Adverse effect of unspecified drugs, medicaments and biological substances, subsequent encounter: Secondary | ICD-10-CM

## 2018-11-07 MED ORDER — EPINEPHRINE 0.3 MG/0.3ML IJ SOAJ: 0.3000 mg | Freq: Once | INTRAMUSCULAR | 2 refills | Status: AC

## 2018-11-07 MED ORDER — OLOPATADINE HCL 0.7 % OP SOLN: 1.0000 [drp] | Freq: Every day | OPHTHALMIC | 5 refills | Status: DC

## 2018-11-07 MED ORDER — CRISABOROLE 2 % EX OINT
1.0000 | TOPICAL_OINTMENT | Freq: Every day | CUTANEOUS | 5 refills | Status: AC | PRN
Start: 2018-11-07 — End: ?

## 2018-11-07 NOTE — Telephone Encounter (Signed)
Prior authorization has been approved and sent to the pharmacy. I am sending a copy to the scan center.

## 2018-11-07 NOTE — Progress Notes (Signed)
New Patient Note  RE: Kylie Powers MRN: [id number] DOB: Jun 14, 1996 Date of Office Visit: 11/07/2018  Referring provider: No ref. provider found Primary care provider: Luciano Cutter, PA-C   Chief Complaint: allergies  History of present illness: Kylie Powers is a 23 y.o. female presenting today for evaluation of allergies.   She states she gets sinus infections frequently.  She feels in 2019 she got 6-7 sinus infections.  She reports symptoms of facial pain, nasal congestion, throat itchiness.  She reports getting steroid shots and antibiotics (usually treated with 1 course of antibiotics) when she does have a sinus infection.  She states she had more sinus infections during the spring.     She reports having itchy/watery eyes, sneezing, nasal congetsion and drainage.  She takes zyrtec daily and  has been taking since she was in middle school.  Does not feel zyrtec works anymore.  She has used flonase but does not it help for congestion.  Has never used eye drops before.    She also reports having drug allergy to sulfa based products.  She recalls taking an sulfa antibiotic and developing hives and throat swelling sensation.  This occured and she was a freshmen in high school.   Topical sulfa products she reports breaking out in hives.    She also reports history of asthma diagnosed in childhood.  She states she played soccer and primarily played in the spring.  She states she would have cough, wheeze and difficult breathing.  She does not remember the last time she has needed to use albuterol inhaler.  No nighttime awakenings.  She denies any hospitalizations.  She also reports going history of eczema with problem areas being chest and back.  She reports using triamcinolone almost daily as the rash flares back up when she stops.  Moisturizes with olay or Dr. Reynold Bowen (sulfa-free products).   She reports being lactose intolerance her "whole life".  She reports she gets sick with  diarrhea and vomiting after dairy ingestion.    Review of systems: Review of Systems  Constitutional: Negative for chills, fever and malaise/fatigue.  HENT: Positive for congestion. Negative for ear discharge, ear pain, nosebleeds, sinus pain and sore throat.   Eyes: Negative for pain, discharge and redness.  Respiratory: Negative for cough, shortness of breath and wheezing.   Cardiovascular: Negative for chest pain.  Gastrointestinal: Negative for abdominal pain, constipation, diarrhea, heartburn, nausea and vomiting.  Musculoskeletal: Negative for joint pain.  Skin: Positive for itching and rash.  Neurological: Negative for headaches.    All other systems negative unless noted above in HPI  Past medical history: Past Medical History:  Diagnosis Date  . Asthma   . Eczema   . Recurrent upper respiratory infection (URI)   . Sinus tachycardia   . Urticaria     Past surgical history: Past Surgical History:  Procedure Laterality Date  . NO PAST SURGERIES      Family history:  Family History  Problem Relation Age of Onset  . Asthma Mother   . Eczema Father   . Eczema Sister   . Urticaria Sister   . Eczema Paternal Grandmother   . Eczema Paternal Grandfather   . Immunodeficiency Neg Hx   . Atopy Neg Hx   . Allergic rhinitis Neg Hx   . Angioedema Neg Hx     Social history: She lives in an apartment with carpeting with electric heating and central cooling.  There is a cat in the home.  There is no concern for water damage, mildew or roaches in the home.  She denies a smoking history.  Medication List: Allergies as of 11/07/2018      Reactions   Sulfa Antibiotics Anaphylaxis   Tramadol Nausea And Vomiting      Medication List       Accurate as of November 07, 2018  1:10 PM. Always use your most recent med list.        Adapalene-Benzoyl Peroxide 0.1-2.5 % gel APP EXT TO FACE QHS   albuterol 108 (90 Base) MCG/ACT inhaler Commonly known as:  PROVENTIL  HFA;VENTOLIN HFA Inhale 2 puffs into the lungs every 2 (two) hours as needed for wheezing or shortness of breath (cough).   triamcinolone cream 0.1 % Commonly known as:  KENALOG APP AA BID   ZYRTEC PO Zyrtec  take 1 tab by mouth prn for allergies       Known medication allergies: Allergies  Allergen Reactions  . Sulfa Antibiotics Anaphylaxis  . Tramadol Nausea And Vomiting     Physical examination: Blood pressure 122/74, pulse 80, temperature 98.4 F (36.9 C), temperature source Oral, resp. rate 16, height 5\' 3"  (1.6 m), weight 169 lb (76.7 kg), SpO2 99 %.  General: Alert, interactive, in no acute distress. HEENT: PERRLA, TMs pearly gray, turbinates moderately edematous without discharge, post-pharynx non erythematous. Neck: Supple without lymphadenopathy. Lungs: Clear to auscultation without wheezing, rhonchi or rales. {no increased work of breathing. CV: Normal S1, S2 without murmurs. Abdomen: Nondistended, nontender. Skin: Dry hyperpigmented papular rash at the mid sternum area. Extremities:  No clubbing, cyanosis or edema. Neuro:   Grossly intact.  Diagnositics/Labs:  Spirometry: FEV1: 2.43L 87%, FVC: 2.7L 85%, ratio consistent with Nonobstructive pattern  Allergy testing: Environmental allergy skin prick testing is positive to elm tree pollen, oak tree pollen, cat. Milk skin prick testing is negative. Intradermal testing is positive to major mold mix 2 and dog. Allergy testing results were read and interpreted by provider, documented by clinical staff.   Assessment and plan:   Allergic rhinitis with conjunctivitis  - environmental allergy skin testing is positive to tree pollens, cat, dog and molds  - avoidance measures discussed/handouts provided today  - would stop zyrtec and try either Xyzal 5mg  or Allegra 180mg  daily (both of these options are long-acting antihistamines like zyrtec)  - for nasal congestion/drainage can use Nasacort 2 sprays each nostril  daily.  Use for 1-2 weeks at time before stopping once improved  - for itchy/watery/red eyes would recommend use of Pazeo or Pataday 1 drop each eye daily as needed   - allergen immunotherapy discussed today including protocol, benefits and risk.  Informational handout provided.  If interested in this therapuetic option you can check with your insurance carrier for coverage.  Schedule first shot appointment when ready to start immunotherapy.  Will provide with epinephrine device (AuviQ or Epipen) to carry on days of your injections.  Ensure to take your antihistamine on days of your injection.   History of asthma, mild intermittent - controlled - have access to albuterol inhaler 2 puffs every 4-6 hours as needed for cough/wheeze/shortness of breath/chest tightness.  May use 15-20 minutes prior to activity.   Monitor frequency of use.    Asthma control goals:   Full participation in all desired activities (may need albuterol before activity)  Albuterol use two time or less a week on average (not counting use with activity)  Cough interfering with sleep two time or less a month  Oral steroids no more than once a year  No hospitalizations  Eczema -  for treatment of eczema areas (dry, itchy, patchy, red areas) use the following:  -  Eucrisa, non-steroidal eczema ointment, on eczema areas including face and body as needed.   - may continue use of triamcinolone ointment as needed for the body  - daily moisturization is key with emollients like Vaseline, Eucerin, CeraVe, Cetaphil, Vanicream  Adverse food reaction   - skin testing to milk is negative.  Thus you do not have IgE mediated milk allergy.     - with lactose intolerance would avoid dairy products or take Lactaid pill or drink Lactaid milk (or other equivalent product) prior to ingestion to help prevent onset of GI discomfort  Drug allergy   - continue avoidance of sulfa based medications  Follow-up 3-4 months or sooner if  needed  I appreciate the opportunity to take part in Monte [place]'s care. Please do not hesitate to contact me with questions.  Sincerely,   Margo AyeShaylar Schylar Wuebker, MD Allergy/Immunology [facility]

## 2018-11-07 NOTE — Telephone Encounter (Signed)
Prior authorization requested for Eucrisa. This has been submitted via covermymeds.  

## 2018-11-07 NOTE — Patient Instructions (Addendum)
Allergic rhinitis with conjunctivitis  - environmental allergy skin testing is positive to tree pollens, cat, dog and molds  - avoidance measures discussed/handouts provided today  - would stop zyrtec and try either Xyzal 5mg  or Allegra 180mg  daily (both of these options are long-acting antihistamines like zyrtec)  - for nasal congestion/drainage can use Nasacort 2 sprays each nostril daily.  Use for 1-2 weeks at time before stopping once improved  - for itchy/watery/red eyes would recommend use of Pazeo or Pataday 1 drop each eye daily as needed   - allergen immunotherapy discussed today including protocol, benefits and risk.  Informational handout provided.  If interested in this therapuetic option you can check with your insurance carrier for coverage.  Schedule first shot appointment when ready to start immunotherapy.  Will provide with epinephrine device (AuviQ or Epipen) to carry on days of your injections.  Ensure to take your antihistamine on days of your injection.   History of asthma  - controlled - have access to albuterol inhaler 2 puffs every 4-6 hours as needed for cough/wheeze/shortness of breath/chest tightness.  May use 15-20 minutes prior to activity.   Monitor frequency of use.    Asthma control goals:   Full participation in all desired activities (may need albuterol before activity)  Albuterol use two time or less a week on average (not counting use with activity)  Cough interfering with sleep two time or less a month  Oral steroids no more than once a year  No hospitalizations  Eczema -  for treatment of eczema areas (dry, itchy, patchy, red areas) use the following:  -  Eucrisa, non-steroidal eczema ointment, on eczema areas including face and body as needed.   - may continue use of triamcinolone ointment as needed for the body  - daily moisturization is key with emollients like Vaseline, Eucerin, CeraVe, Cetaphil, Vanicream  Adverse food reaction   - skin  testing to milk is negative.  Thus you do not have IgE mediated milk allergy.     - with lactose intolerance would avoid dairy products or take Lactaid pill or drink Lactaid milk (or other equivalent product) prior to ingestion to help prevent onset of GI discomfort  Drug allergy   - continue avoidance of sulfa based medications  Follow-up 3-4 months or sooner if needed

## 2018-11-14 DIAGNOSIS — J301 Allergic rhinitis due to pollen: Secondary | ICD-10-CM | POA: Diagnosis not present

## 2018-11-14 NOTE — Progress Notes (Signed)
VIALS EXP 11-15-2019

## 2018-11-14 NOTE — Addendum Note (Signed)
Addended by: Lorrin Mais on: 11/14/2018 10:03 AM   Modules accepted: Orders

## 2018-11-15 DIAGNOSIS — J3089 Other allergic rhinitis: Secondary | ICD-10-CM

## 2018-11-18 ENCOUNTER — Encounter: Payer: Self-pay | Admitting: Allergy

## 2018-11-18 ENCOUNTER — Ambulatory Visit: Payer: BC Managed Care – PPO | Admitting: Allergy

## 2018-11-18 VITALS — BP 110/70 | HR 88 | Temp 98.3°F | Resp 16 | Ht 63.0 in | Wt 170.0 lb

## 2018-11-18 DIAGNOSIS — J302 Other seasonal allergic rhinitis: Secondary | ICD-10-CM

## 2018-11-18 DIAGNOSIS — J4520 Mild intermittent asthma, uncomplicated: Secondary | ICD-10-CM | POA: Diagnosis not present

## 2018-11-18 DIAGNOSIS — H1010 Acute atopic conjunctivitis, unspecified eye: Secondary | ICD-10-CM | POA: Insufficient documentation

## 2018-11-18 DIAGNOSIS — J3089 Other allergic rhinitis: Secondary | ICD-10-CM

## 2018-11-18 DIAGNOSIS — T7840XA Allergy, unspecified, initial encounter: Secondary | ICD-10-CM

## 2018-11-18 MED ORDER — ALBUTEROL SULFATE HFA 108 (90 BASE) MCG/ACT IN AERS: 2.0000 | INHALATION_SPRAY | RESPIRATORY_TRACT | 0 refills | Status: AC | PRN

## 2018-11-18 MED ORDER — MONTELUKAST SODIUM 10 MG PO TABS: 10.0000 mg | ORAL_TABLET | Freq: Every day | ORAL | 5 refills | Status: DC

## 2018-11-18 NOTE — Assessment & Plan Note (Addendum)
Past history - 2020 skin testing was positive to tree pollen, cat, mold, dog.  Continue allegra daily.  For nasal congestion/drainage can use Nasacort 2 sprays each nostril daily.  Use for 1-2 weeks at time before stopping once improved.  For itchy/watery/red eyes would recommend use of Pazeo or Pataday 1 drop each eye daily as needed.  Start allergy shots next week if feeling better.   Start Singulair 10mg  daily as symptoms flare in the spring season.

## 2018-11-18 NOTE — Assessment & Plan Note (Signed)
Woke up Friday morning with facial erythema/hives which traveled down to her neck. Associated symptoms include pruritus, shortness of breath and dry coughing. Took benadryl and topical steroid cream with some benefit. Only new food she ate was a veggie burger. Denies any changes in medications or personal care products.  Start prednisone taper.  Increase allegra 180mg  to twice a day for the next week.  Hold off allergy injections until next week.  Do not eat the veggie burger and get ingredient list if able.   For mild symptoms you can take over the counter antihistamines such as Benadryl and monitor symptoms closely. If symptoms worsen or if you have severe symptoms including breathing issues, throat closure, significant swelling, whole body hives, severe diarrhea and vomiting, lightheadedness then inject epinephrine and seek immediate medical care afterwards.

## 2018-11-18 NOTE — Assessment & Plan Note (Signed)
Today's spirometry was normal. However, this reaction is flaring her symptoms.  The prednisone taper should help with this.  May use albuterol rescue inhaler 2 puffs or nebulizer every 4 to 6 hours as needed for shortness of breath, chest tightness, coughing, and wheezing. May use albuterol rescue inhaler 2 puffs 5 to 15 minutes prior to strenuous physical activities. Monitor frequency of use.

## 2018-11-18 NOTE — Progress Notes (Signed)
Follow Up Note  RE: Kylie Powers MRN: [id number] DOB: 1995-11-13 Date of Office Visit: 11/18/2018  Referring provider: Luciano Cutter, PA* Primary care provider: Luciano Cutter, PA-C  Chief Complaint: Shortness of Breath and Urticaria  History of Present Illness: I had the pleasure of seeing Kylie Powers for a follow up visit at the [facility] on 11/18/2018. She is a 23 y.o. female, who is being followed for allergic rhinoconjunctivitis, asthma, atopic dermatitis, adverse drug reaction, adverse food reaction. Today she is here for new complaint of shortness of breath and hives. Her previous allergy office visit was on 11/07/2018 with Dr. Delorse Lek.   Allergic reaction Patient woke up Friday morning with hives on the face and neck. The neck area cleared up but still has hives on the face. Describes it as extremely pruritic. She also had some shortness of breath and dry coughing. No wheezing. No fevers or chills. Used albuterol for the last 3 days BID with good benefit but noticed that it has expired.   Patient took benadryl and topical hydrocortisone cream with some benefit. Denies any changes in medications, personal care products, or recent infections. She did have a new brand of veggie burgers made with black beans for dinner on Thursday.   Patient had similar reaction as above with the rash to sulfa in the past.   Allergic rhinitis with conjunctivitis Currently on allegra 180mg  at night and was supposed to start allergy injections this week. Symptoms flare during the spring.   Assessment and Plan: Kylie Powers is a 23 y.o. female with: Allergic reaction Woke up Friday morning with facial erythema/hives which traveled down to her neck. Associated symptoms include pruritus, shortness of breath and dry coughing. Took benadryl and topical steroid cream with some benefit. Only new food she ate was a veggie burger. Denies any changes in medications or personal care  products.  Start prednisone taper.  Increase allegra 180mg  to twice a day for the next week.  Hold off allergy injections until next week.  Do not eat the veggie burger and get ingredient list if able.   For mild symptoms you can take over the counter antihistamines such as Benadryl and monitor symptoms closely. If symptoms worsen or if you have severe symptoms including breathing issues, throat closure, significant swelling, whole body hives, severe diarrhea and vomiting, lightheadedness then inject epinephrine and seek immediate medical care afterwards.  Mild intermittent asthma, uncomplicated Today's spirometry was normal. However, this reaction is flaring her symptoms.  The prednisone taper should help with this.  May use albuterol rescue inhaler 2 puffs or nebulizer every 4 to 6 hours as needed for shortness of breath, chest tightness, coughing, and wheezing. May use albuterol rescue inhaler 2 puffs 5 to 15 minutes prior to strenuous physical activities. Monitor frequency of use.   Seasonal and perennial allergic rhinoconjunctivitis Past history - 2020 skin testing was positive to tree pollen, cat, mold, dog.  Continue allegra daily.  For nasal congestion/drainage can use Nasacort 2 sprays each nostril daily.  Use for 1-2 weeks at time before stopping once improved.  For itchy/watery/red eyes would recommend use of Pazeo or Pataday 1 drop each eye daily as needed.  Start allergy shots next week if feeling better.   Start Singulair 10mg  daily as symptoms flare in the spring season.   Return in about 4 months (around 03/19/2019).  Meds ordered this encounter  Medications  . montelukast (SINGULAIR) 10 MG tablet    Sig: Take 1  tablet (10 mg total) by mouth at bedtime.    Dispense:  30 tablet    Refill:  5  . albuterol (PROVENTIL HFA;VENTOLIN HFA) 108 (90 Base) MCG/ACT inhaler    Sig: Inhale 2 puffs into the lungs every 2 (two) hours as needed for wheezing or shortness of  breath (cough).    Dispense:  1 Inhaler    Refill:  0   Diagnostics: Spirometry:  Tracings reviewed. Her effort: Good reproducible efforts. FVC: 2.78L FEV1: 2.40L, 86% predicted FEV1/FVC ratio: 86% Interpretation: Spirometry consistent with normal pattern.  Please see scanned spirometry results for details.  Medication List:  Current Outpatient Medications  Medication Sig Dispense Refill  . Adapalene-Benzoyl Peroxide 0.1-2.5 % gel APP EXT TO FACE QHS    . albuterol (PROVENTIL HFA;VENTOLIN HFA) 108 (90 Base) MCG/ACT inhaler Inhale 2 puffs into the lungs every 2 (two) hours as needed for wheezing or shortness of breath (cough). 1 Inhaler 0  . Cetirizine HCl (ZYRTEC PO) Zyrtec  take 1 tab by mouth prn for allergies    . Crisaborole (EUCRISA) 2 % OINT Apply 1 application topically daily as needed. 100 g 5  . Olopatadine HCl (PAZEO) 0.7 % SOLN Place 1 drop into both eyes daily. 1 Bottle 5  . triamcinolone cream (KENALOG) 0.1 % APP AA BID    . montelukast (SINGULAIR) 10 MG tablet Take 1 tablet (10 mg total) by mouth at bedtime. 30 tablet 5   No current facility-administered medications for this visit.    Allergies: Allergies  Allergen Reactions  . Sulfa Antibiotics Anaphylaxis  . Tramadol Nausea And Vomiting   I reviewed her past medical history, social history, family history, and environmental history and no significant changes have been reported from previous visit on 11/07/2018.  Review of Systems  Constitutional: Negative for appetite change, chills, fever and unexpected weight change.  HENT: Negative for congestion and rhinorrhea.   Eyes: Negative for itching.  Respiratory: Positive for shortness of breath. Negative for cough, chest tightness and wheezing.   Gastrointestinal: Negative for abdominal pain.  Skin: Positive for rash.  Allergic/Immunologic: Positive for environmental allergies.  Neurological: Negative for headaches.   Objective: BP 110/70   Pulse 88   Temp  98.3 F (36.8 C) (Oral)   Resp 16   Ht 5\' 3"  (1.6 m)   Wt 170 lb (77.1 kg)   SpO2 97%   BMI 30.11 kg/m  Body mass index is 30.11 kg/m. Physical Exam  Constitutional: She is oriented to person, place, and time. She appears well-developed and well-nourished.  HENT:  Head: Normocephalic and atraumatic.  Right Ear: External ear normal.  Left Ear: External ear normal.  Nose: Nose normal.  Mouth/Throat: Oropharynx is clear and moist.  Eyes: Conjunctivae and EOM are normal.  Neck: Neck supple.  Cardiovascular: Normal rate, regular rhythm and normal heart sounds. Exam reveals no gallop and no friction rub.  No murmur heard. Pulmonary/Chest: Effort normal and breath sounds normal. She has no wheezes. She has no rales.  Lymphadenopathy:    She has no cervical adenopathy.  Neurological: She is alert and oriented to person, place, and time.  Skin: Skin is warm. Rash noted.  Facial erythema and looks slightly edematous.    Psychiatric: She has a normal mood and affect. Her behavior is normal.  Nursing note and vitals reviewed.  Previous notes and tests were reviewed. The plan was reviewed with the patient/family, and all questions/concerned were addressed.  It was my pleasure to see Kylie Powers  today and participate in her care. Please feel free to contact me with any questions or concerns.  Sincerely,  Rexene Alberts, DO Allergy & Immunology  [facility] office: [phone] [facility] office: [phone]

## 2018-11-18 NOTE — Patient Instructions (Addendum)
Allergic reaction  Start prednisone taper.  Increase allegra 180mg  to twice a day for the next week.  Hold off allergy injections until next week.  Do not eat the veggie burger and get ingredient list.  For mild symptoms you can take over the counter antihistamines such as Benadryl and monitor symptoms closely. If symptoms worsen or if you have severe symptoms including breathing issues, throat closure, significant swelling, whole body hives, severe diarrhea and vomiting, lightheadedness then inject epinephrine and seek immediate medical care afterwards.  Allergic rhinitis with conjunctivitis  Continue allegra daily.  For nasal congestion/drainage can use Nasacort 2 sprays each nostril daily.  Use for 1-2 weeks at time before stopping once improved.  For itchy/watery/red eyes would recommend use of Pazeo or Pataday 1 drop each eye daily as needed.  Start allergy shots next week if feeling better.   Start singulair 10mg  daily.   History of asthma  May use albuterol rescue inhaler 2 puffs or nebulizer every 4 to 6 hours as needed for shortness of breath, chest tightness, coughing, and wheezing. May use albuterol rescue inhaler 2 puffs 5 to 15 minutes prior to strenuous physical activities. Monitor frequency of use.  Asthma control goals:   Full participation in all desired activities (may need albuterol before activity)  Albuterol use two time or less a week on average (not counting use with activity)  Cough interfering with sleep two time or less a month  Oral steroids no more than once a year  No hospitalizations  Follow up as scheduled with Dr. Delorse Lek.

## 2018-11-22 ENCOUNTER — Ambulatory Visit: Payer: BC Managed Care – PPO

## 2018-12-17 ENCOUNTER — Ambulatory Visit: Payer: Self-pay

## 2019-02-26 ENCOUNTER — Encounter: Payer: Self-pay | Admitting: Allergy

## 2019-02-26 ENCOUNTER — Ambulatory Visit: Payer: BC Managed Care – PPO | Admitting: Allergy

## 2019-02-26 ENCOUNTER — Other Ambulatory Visit: Payer: Self-pay

## 2019-02-26 VITALS — BP 120/80 | HR 93 | Temp 98.3°F | Resp 16 | Ht 62.8 in | Wt 160.2 lb

## 2019-02-26 DIAGNOSIS — J3089 Other allergic rhinitis: Secondary | ICD-10-CM

## 2019-02-26 DIAGNOSIS — J4520 Mild intermittent asthma, uncomplicated: Secondary | ICD-10-CM

## 2019-02-26 DIAGNOSIS — H1013 Acute atopic conjunctivitis, bilateral: Secondary | ICD-10-CM | POA: Diagnosis not present

## 2019-02-26 MED ORDER — TRIAMCINOLONE ACETONIDE 55 MCG/ACT NA AERO: 2.0000 | INHALATION_SPRAY | Freq: Every day | NASAL | 5 refills | Status: AC

## 2019-02-26 MED ORDER — MONTELUKAST SODIUM 10 MG PO TABS: 10.0000 mg | ORAL_TABLET | Freq: Every day | ORAL | 5 refills | Status: DC

## 2019-02-26 MED ORDER — FEXOFENADINE HCL 180 MG PO TABS: 180.0000 mg | ORAL_TABLET | Freq: Every day | ORAL | 5 refills | Status: AC

## 2019-02-26 NOTE — Patient Instructions (Addendum)
Allergic rhinitis with conjunctivitis  Continue allegra daily.  For nasal congestion/drainage can use Nasacort 2 sprays each nostril daily.  Use for 1-2 weeks at time before stopping once improved.  For itchy/watery/red eyes would recommend use of Pazeo 1 drop each eye daily as needed.  Continue singulair 10mg  daily.   Started allergen immunotherapy today. Continue weekly build-up injections and bring your epinephrine device with you on days of your injections.   Asthma  May use albuterol rescue inhaler 2 puffs or nebulizer every 4 to 6 hours as needed for shortness of breath, chest tightness, coughing, and wheezing. May use albuterol rescue inhaler 2 puffs 5 to 15 minutes prior to strenuous physical activities. Monitor frequency of use.  singulair as above  Asthma control goals:   Full participation in all desired activities (may need albuterol before activity)  Albuterol use two time or less a week on average (not counting use with activity)  Cough interfering with sleep two time or less a month  Oral steroids no more than once a year  No hospitalizations  Follow up in 4-6 months or sooner if needed

## 2019-02-26 NOTE — Progress Notes (Signed)
Immunotherapy   Patient Details  Name: Jalin Schab MRN: [id number] Date of Birth: 20-Oct-1995  02/26/2019  Ova Freshwater started injections for  Pollen-Pet & Mold Following schedule: B  Frequency:1 time per week Epi-Pen:Epi-Pen Available  Consent signed and patient instructions given. Patient waited 30 minutes post injection with no local or systemic reactions.   Dorathy Daft I Christabelle Hanzlik 02/26/2019, 12:01 PM

## 2019-02-26 NOTE — Progress Notes (Signed)
Follow-up Note  RE: Kylie Powers MRN: [id number] DOB: 25-Dec-1995 Date of Office Visit: 02/26/2019   History of present illness: Kylie Powers is a 23 y.o. female presenting today for follow-up of asthma, allergic rhinitis with conjunctivitis.  She was last seen on November 18, 2018 by Dr. Selena Batten at which time she was treated for allergic reaction after waking up with facial erythema and hives as well as associated shortness of breath and cough.  She was treated with a prednisone taper as well as advised to take Allegra twice a day.  She states that her symptoms improved and she has not had any further reactions since that time. She does continue on daily Allegra.  She also uses Singulair daily.  She will use Nasacort and Pazeo as needed.  She was set to start on allergen immunotherapy in March however she developed COVID and states that she had fever and general fatigue.  Thus she did not start the allergy injections at that time.  She is interested today in starting. In regards to her asthma she states that she has used albuterol only 1-2 times since the February's visit.  She states she normally has needed to use if she has been around any cats or dogs however she states that she does have cats and dogs in her home.  She is more symptomatic around other people's cats and dogs.  Review of systems: Review of Systems  Constitutional: Negative for chills, fever and malaise/fatigue.  HENT: Negative for congestion, ear discharge, nosebleeds and sore throat.   Eyes: Negative for pain, discharge and redness.  Respiratory: Negative for cough, shortness of breath and wheezing.   Cardiovascular: Negative for chest pain.  Gastrointestinal: Negative for abdominal pain, constipation, diarrhea, heartburn, nausea and vomiting.  Musculoskeletal: Negative for joint pain.  Skin: Negative for itching and rash.  Neurological: Negative for headaches.    All other systems negative unless noted above in HPI   Past medical/social/surgical/family history have been reviewed and are unchanged unless specifically indicated below.  No changes  Medication List: Allergies as of 02/26/2019      Reactions   Sulfa Antibiotics Anaphylaxis   Tramadol Nausea And Vomiting      Medication List       Accurate as of February 26, 2019  5:17 PM. If you have any questions, ask your nurse or doctor.        STOP taking these medications   ZYRTEC PO Stopped by:  Rufus Cypert Larose Hires, MD     TAKE these medications   Adapalene-Benzoyl Peroxide 0.1-2.5 % gel APP EXT TO FACE QHS   albuterol 108 (90 Base) MCG/ACT inhaler Commonly known as:  VENTOLIN HFA Inhale 2 puffs into the lungs every 2 (two) hours as needed for wheezing or shortness of breath (cough).   Allegra Allergy 180 MG tablet Generic drug:  fexofenadine Take 180 mg by mouth daily. What changed:  Another medication with the same name was added. Make sure you understand how and when to take each. Changed by:  Devaunte Gasparini Larose Hires, MD   fexofenadine 180 MG tablet Commonly known as:  ALLEGRA Take 1 tablet (180 mg total) by mouth daily. What changed:  You were already taking a medication with the same name, and this prescription was added. Make sure you understand how and when to take each. Changed by:  Rolande Moe Larose Hires, MD   Crisaborole 2 % Oint Commonly known as:  [place] Apply 1 application topically daily as needed.  montelukast 10 MG tablet Commonly known as:  SINGULAIR Take 1 tablet (10 mg total) by mouth at bedtime. What changed:  Another medication with the same name was added. Make sure you understand how and when to take each. Changed by:  Adarian Bur Larose HiresPatricia Jilberto Vanderwall, MD   montelukast 10 MG tablet Commonly known as:  SINGULAIR Take 1 tablet (10 mg total) by mouth at bedtime. What changed:  You were already taking a medication with the same name, and this prescription was added. Make sure you understand how and when to  take each. Changed by:  Arrin Pintor Larose HiresPatricia Sienna Stonehocker, MD   Olopatadine HCl 0.7 % Soln Commonly known as:  Pazeo Place 1 drop into both eyes daily.   triamcinolone 55 MCG/ACT Aero nasal inhaler Commonly known as:  Nasacort Allergy 24HR Place 2 sprays into the nose daily. Started by:  Copeland Lapier Larose HiresPatricia Brooklyn Jeff, MD   triamcinolone cream 0.1 % Commonly known as:  KENALOG APP AA BID       Known medication allergies: Allergies  Allergen Reactions  . Sulfa Antibiotics Anaphylaxis  . Tramadol Nausea And Vomiting     Physical examination: Blood pressure 120/80, pulse 93, temperature 98.3 F (36.8 C), temperature source Temporal, resp. rate 16, height 5' 2.8" (1.595 m), weight 160 lb 3.2 oz (72.7 kg), SpO2 98 %.  General: Alert, interactive, in no acute distress. HEENT: PERRLA, TMs pearly gray, turbinates minimally edematous without discharge, post-pharynx non erythematous. Neck: Supple without lymphadenopathy. Lungs: Clear to auscultation without wheezing, rhonchi or rales. {no increased work of breathing. CV: Normal S1, S2 without murmurs. Abdomen: Nondistended, nontender. Skin: Warm and dry, without lesions or rashes. Extremities:  No clubbing, cyanosis or edema. Neuro:   Grossly intact.  Diagnositics/Labs: Allergen injections started today without adverse event.  Assessment and plan:   Allergic rhinitis with conjunctivitis  Continue allegra daily.  For nasal congestion/drainage can use Nasacort 2 sprays each nostril daily.  Use for 1-2 weeks at time before stopping once improved.  For itchy/watery/red eyes would recommend use of Pazeo 1 drop each eye daily as needed.  Continue singulair 10mg  daily.   Started allergen immunotherapy today. Continue weekly build-up injections and bring your epinephrine device with you on days of your injections.   Asthma, mild intermittent  May use albuterol rescue inhaler 2 puffs or nebulizer every 4 to 6 hours as needed for shortness  of breath, chest tightness, coughing, and wheezing. May use albuterol rescue inhaler 2 puffs 5 to 15 minutes prior to strenuous physical activities. Monitor frequency of use.  singulair as above  Asthma control goals:   Full participation in all desired activities (may need albuterol before activity)  Albuterol use two time or less a week on average (not counting use with activity)  Cough interfering with sleep two time or less a month  Oral steroids no more than once a year  No hospitalizations  Follow up in 4-6 months or sooner if needed  I appreciate the opportunity to take part in [place] care. Please do not hesitate to contact me with questions.  Sincerely,   Margo AyeShaylar Tanja Gift, MD Allergy/Immunology [facility]

## 2019-02-27 ENCOUNTER — Ambulatory Visit: Payer: BC Managed Care – PPO | Admitting: Allergy

## 2019-03-12 ENCOUNTER — Ambulatory Visit (INDEPENDENT_AMBULATORY_CARE_PROVIDER_SITE_OTHER): Payer: BC Managed Care – PPO

## 2019-03-12 DIAGNOSIS — J309 Allergic rhinitis, unspecified: Secondary | ICD-10-CM

## 2019-03-24 ENCOUNTER — Ambulatory Visit (INDEPENDENT_AMBULATORY_CARE_PROVIDER_SITE_OTHER): Payer: BC Managed Care – PPO

## 2019-03-24 DIAGNOSIS — J309 Allergic rhinitis, unspecified: Secondary | ICD-10-CM | POA: Diagnosis not present

## 2019-04-03 ENCOUNTER — Ambulatory Visit (INDEPENDENT_AMBULATORY_CARE_PROVIDER_SITE_OTHER): Payer: BC Managed Care – PPO | Admitting: *Deleted

## 2019-04-03 DIAGNOSIS — J309 Allergic rhinitis, unspecified: Secondary | ICD-10-CM | POA: Diagnosis not present

## 2019-04-08 ENCOUNTER — Ambulatory Visit (INDEPENDENT_AMBULATORY_CARE_PROVIDER_SITE_OTHER): Payer: BC Managed Care – PPO | Admitting: *Deleted

## 2019-04-08 DIAGNOSIS — J309 Allergic rhinitis, unspecified: Secondary | ICD-10-CM | POA: Diagnosis not present

## 2019-04-15 ENCOUNTER — Ambulatory Visit (INDEPENDENT_AMBULATORY_CARE_PROVIDER_SITE_OTHER): Payer: BC Managed Care – PPO | Admitting: *Deleted

## 2019-04-15 DIAGNOSIS — J309 Allergic rhinitis, unspecified: Secondary | ICD-10-CM

## 2019-04-28 ENCOUNTER — Telehealth: Payer: Self-pay | Admitting: *Deleted

## 2019-04-28 ENCOUNTER — Ambulatory Visit (INDEPENDENT_AMBULATORY_CARE_PROVIDER_SITE_OTHER): Payer: BC Managed Care – PPO | Admitting: *Deleted

## 2019-04-28 DIAGNOSIS — J309 Allergic rhinitis, unspecified: Secondary | ICD-10-CM | POA: Diagnosis not present

## 2019-04-28 NOTE — Telephone Encounter (Signed)
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 She lives in [place] and works in [place]. Please advise.

## 2019-04-29 NOTE — Telephone Encounter (Signed)
Is there a doctor's office in [place] that she would like to receive her injections?  If there is a Firefighter staffed with a MD/DO/NP where she would like to have them done at then we can help arrange that.

## 2019-04-29 NOTE — Telephone Encounter (Signed)
Patient called back advised if there was a doctor in [place] we could send the vials to. Patient states she will find an allergist in [place] and notify us when she is established so we can transfer vials.

## 2019-04-29 NOTE — Telephone Encounter (Signed)
Called and left voicemail to return call need to advise as written per Dr Nelva Bush

## 2019-05-07 ENCOUNTER — Ambulatory Visit (INDEPENDENT_AMBULATORY_CARE_PROVIDER_SITE_OTHER): Payer: BC Managed Care – PPO

## 2019-05-07 DIAGNOSIS — J309 Allergic rhinitis, unspecified: Secondary | ICD-10-CM | POA: Diagnosis not present

## 2019-05-15 ENCOUNTER — Ambulatory Visit (INDEPENDENT_AMBULATORY_CARE_PROVIDER_SITE_OTHER): Payer: BC Managed Care – PPO | Admitting: *Deleted

## 2019-05-15 DIAGNOSIS — J309 Allergic rhinitis, unspecified: Secondary | ICD-10-CM | POA: Diagnosis not present

## 2019-05-22 ENCOUNTER — Ambulatory Visit (INDEPENDENT_AMBULATORY_CARE_PROVIDER_SITE_OTHER): Payer: BC Managed Care – PPO | Admitting: *Deleted

## 2019-05-22 DIAGNOSIS — J309 Allergic rhinitis, unspecified: Secondary | ICD-10-CM

## 2019-05-29 ENCOUNTER — Ambulatory Visit (INDEPENDENT_AMBULATORY_CARE_PROVIDER_SITE_OTHER): Payer: BC Managed Care – PPO | Admitting: *Deleted

## 2019-05-29 DIAGNOSIS — J309 Allergic rhinitis, unspecified: Secondary | ICD-10-CM | POA: Diagnosis not present

## 2019-06-04 ENCOUNTER — Other Ambulatory Visit: Payer: Self-pay | Admitting: Allergy

## 2019-06-06 ENCOUNTER — Ambulatory Visit (INDEPENDENT_AMBULATORY_CARE_PROVIDER_SITE_OTHER): Payer: BC Managed Care – PPO

## 2019-06-06 DIAGNOSIS — J309 Allergic rhinitis, unspecified: Secondary | ICD-10-CM

## 2019-06-12 ENCOUNTER — Ambulatory Visit (INDEPENDENT_AMBULATORY_CARE_PROVIDER_SITE_OTHER): Payer: BC Managed Care – PPO

## 2019-06-12 DIAGNOSIS — J309 Allergic rhinitis, unspecified: Secondary | ICD-10-CM

## 2019-06-26 ENCOUNTER — Ambulatory Visit (INDEPENDENT_AMBULATORY_CARE_PROVIDER_SITE_OTHER): Payer: BC Managed Care – PPO | Admitting: *Deleted

## 2019-06-26 DIAGNOSIS — J309 Allergic rhinitis, unspecified: Secondary | ICD-10-CM

## 2019-07-03 ENCOUNTER — Ambulatory Visit (INDEPENDENT_AMBULATORY_CARE_PROVIDER_SITE_OTHER): Payer: BC Managed Care – PPO | Admitting: *Deleted

## 2019-07-03 DIAGNOSIS — J309 Allergic rhinitis, unspecified: Secondary | ICD-10-CM

## 2019-07-08 ENCOUNTER — Ambulatory Visit (INDEPENDENT_AMBULATORY_CARE_PROVIDER_SITE_OTHER): Payer: BC Managed Care – PPO | Admitting: *Deleted

## 2019-07-08 DIAGNOSIS — J309 Allergic rhinitis, unspecified: Secondary | ICD-10-CM | POA: Diagnosis not present

## 2019-07-14 ENCOUNTER — Ambulatory Visit (INDEPENDENT_AMBULATORY_CARE_PROVIDER_SITE_OTHER): Payer: BC Managed Care – PPO

## 2019-07-14 DIAGNOSIS — J309 Allergic rhinitis, unspecified: Secondary | ICD-10-CM | POA: Diagnosis not present

## 2019-07-29 ENCOUNTER — Ambulatory Visit (INDEPENDENT_AMBULATORY_CARE_PROVIDER_SITE_OTHER): Payer: BC Managed Care – PPO | Admitting: *Deleted

## 2019-07-29 DIAGNOSIS — J309 Allergic rhinitis, unspecified: Secondary | ICD-10-CM | POA: Diagnosis not present

## 2019-08-07 ENCOUNTER — Ambulatory Visit (INDEPENDENT_AMBULATORY_CARE_PROVIDER_SITE_OTHER): Payer: BC Managed Care – PPO

## 2019-08-07 DIAGNOSIS — J309 Allergic rhinitis, unspecified: Secondary | ICD-10-CM | POA: Diagnosis not present

## 2019-08-14 ENCOUNTER — Ambulatory Visit (INDEPENDENT_AMBULATORY_CARE_PROVIDER_SITE_OTHER): Payer: BC Managed Care – PPO

## 2019-08-14 DIAGNOSIS — J309 Allergic rhinitis, unspecified: Secondary | ICD-10-CM

## 2019-08-28 ENCOUNTER — Ambulatory Visit (INDEPENDENT_AMBULATORY_CARE_PROVIDER_SITE_OTHER): Payer: BC Managed Care – PPO | Admitting: *Deleted

## 2019-08-28 ENCOUNTER — Ambulatory Visit: Payer: BC Managed Care – PPO | Admitting: Allergy

## 2019-08-28 DIAGNOSIS — J309 Allergic rhinitis, unspecified: Secondary | ICD-10-CM | POA: Diagnosis not present

## 2019-09-09 ENCOUNTER — Ambulatory Visit (INDEPENDENT_AMBULATORY_CARE_PROVIDER_SITE_OTHER): Payer: BC Managed Care – PPO

## 2019-09-09 DIAGNOSIS — J309 Allergic rhinitis, unspecified: Secondary | ICD-10-CM

## 2019-09-22 ENCOUNTER — Ambulatory Visit (INDEPENDENT_AMBULATORY_CARE_PROVIDER_SITE_OTHER): Payer: BC Managed Care – PPO

## 2019-09-22 DIAGNOSIS — J309 Allergic rhinitis, unspecified: Secondary | ICD-10-CM

## 2019-09-30 ENCOUNTER — Ambulatory Visit (INDEPENDENT_AMBULATORY_CARE_PROVIDER_SITE_OTHER): Payer: Managed Care, Other (non HMO)

## 2019-09-30 DIAGNOSIS — J309 Allergic rhinitis, unspecified: Secondary | ICD-10-CM | POA: Diagnosis not present

## 2019-10-06 ENCOUNTER — Other Ambulatory Visit: Payer: Self-pay | Admitting: *Deleted

## 2019-10-06 MED ORDER — MONTELUKAST SODIUM 10 MG PO TABS: 10.0000 mg | ORAL_TABLET | Freq: Every day | ORAL | 5 refills | Status: DC

## 2019-10-09 ENCOUNTER — Encounter: Payer: Self-pay | Admitting: Allergy

## 2019-10-09 ENCOUNTER — Other Ambulatory Visit: Payer: Self-pay

## 2019-10-09 ENCOUNTER — Ambulatory Visit: Payer: BC Managed Care – PPO | Admitting: Allergy

## 2019-10-09 ENCOUNTER — Ambulatory Visit: Payer: Self-pay

## 2019-10-09 VITALS — BP 126/80 | HR 106 | Temp 97.7°F | Resp 16 | Ht 63.0 in | Wt 168.2 lb

## 2019-10-09 DIAGNOSIS — J3089 Other allergic rhinitis: Secondary | ICD-10-CM | POA: Diagnosis not present

## 2019-10-09 DIAGNOSIS — J309 Allergic rhinitis, unspecified: Secondary | ICD-10-CM

## 2019-10-09 DIAGNOSIS — H1013 Acute atopic conjunctivitis, bilateral: Secondary | ICD-10-CM | POA: Diagnosis not present

## 2019-10-09 DIAGNOSIS — J4520 Mild intermittent asthma, uncomplicated: Secondary | ICD-10-CM

## 2019-10-09 MED ORDER — LEVALBUTEROL TARTRATE 45 MCG/ACT IN AERO: 2.0000 | INHALATION_SPRAY | Freq: Four times a day (QID) | RESPIRATORY_TRACT | 5 refills | Status: AC | PRN

## 2019-10-09 MED ORDER — LEVALBUTEROL HCL 1.25 MG/3ML IN NEBU: 1.2500 mg | INHALATION_SOLUTION | RESPIRATORY_TRACT | 5 refills | Status: AC | PRN

## 2019-10-09 MED ORDER — OLOPATADINE HCL 0.1 % OP SOLN: 1.0000 [drp] | Freq: Two times a day (BID) | OPHTHALMIC | 5 refills | Status: AC

## 2019-10-09 NOTE — Progress Notes (Signed)
Follow-up Note  RE: Kylie Powers MRN: [id number] DOB: 1995/10/03 Date of Office Visit: 10/09/2019   History of present illness: Kylie Powers is a 24 y.o. female presenting today for follow-up of allergic rhinitis with conjunctivitis on immunotherapy and asthma.  She was last seen in the office on February 26, 2019 by myself.  At that visit she was started on allergen immunotherapy.  She has now reached the maintenance red vial. She is tolerating allergy shots without any large local or systemic symptoms.  She is taking allegra daily.  She has access to nasacort and pazeo for as needed use of nasal or ocular symptoms.  She continues on singulair daily.   With her asthma she has access to albuterol inhaler and nebulizer.  She is concerned as albuterol is albuterol sulfate and she has history of sulfa allergy and states she has had reactions to sulfates as well.  She would like to change to xopenex to avoid sulfates.  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  Denies any nighttime awakenings and has not needed to use any systemic steroids, ED/UC visits.  She did get her Covid vaccines already.   Review of systems: Review of Systems  Constitutional: Negative.   HENT: Negative.   Eyes: Negative.   Respiratory: Negative.   Cardiovascular: Negative.   Gastrointestinal: Negative.   Musculoskeletal: Negative.   Skin: Negative.   Neurological: Negative.     All other systems negative unless noted above in HPI  Past medical/social/surgical/family history have been reviewed and are unchanged unless specifically indicated below.  No changes  Medication List: Current Outpatient Medications  Medication Sig Dispense Refill  . Adapalene-Benzoyl Peroxide 0.1-2.5 % gel APP EXT TO FACE QHS    . albuterol (PROVENTIL HFA;VENTOLIN HFA) 108 (90 Base) MCG/ACT inhaler Inhale 2 puffs into  the lungs every 2 (two) hours as needed for wheezing or shortness of breath (cough). 1 Inhaler 0  . Crisaborole (EUCRISA) 2 % OINT Apply 1 application topically daily as needed. 100 g 5  . fexofenadine (ALLEGRA) 180 MG tablet Take 1 tablet (180 mg total) by mouth daily. 30 tablet 5  . montelukast (SINGULAIR) 10 MG tablet Take 1 tablet (10 mg total) by mouth at bedtime. 30 tablet 5  . triamcinolone (NASACORT ALLERGY 24HR) 55 MCG/ACT AERO nasal inhaler Place 2 sprays into the nose daily. 1 Inhaler 5   No current facility-administered medications for this visit.     Known medication allergies: Allergies  Allergen Reactions  . Sulfa Antibiotics Anaphylaxis  . Tramadol Nausea And Vomiting     Physical examination: Blood pressure 126/80, pulse (!) 106, temperature 97.7 F (36.5 C), temperature source Temporal, resp. rate 16, height 5\' 3"  (1.6 m), weight 168 lb 3.2 oz (76.3 kg), SpO2 100 %.  General: Alert, interactive, in no acute distress. HEENT: PERRLA, TMs pearly gray, turbinates mildly edematous without discharge, post-pharynx non erythematous. Neck: Supple without lymphadenopathy. Lungs: Clear to auscultation without wheezing, rhonchi or rales. {no increased work of breathing. CV: Normal S1, S2 without murmurs. Abdomen: Nondistended, nontender. Skin: Warm and dry, without lesions or rashes. Extremities:  No clubbing, cyanosis or edema. Neuro:   Grossly intact.  Diagnositics/Labs:  Spirometry: FEV1: 2.43L 87%, FVC: 3.11L 98%, ratio consistent with nonobstructive pattern  Assessment and plan:   Allergic rhinitis with conjunctivitis  Continue allegra daily as needed  For nasal congestion/drainage can use Nasacort 2 sprays each nostril daily.  Use for 1-2 weeks at time before stopping once improved.  For itchy/watery/red eyes would recommend use of Pazeo 1 drop each eye daily as needed.  Continue singulair 10mg  daily.   Continue allergen immunotherapy per schedule.  You are  now at maintenance dosing (red vial)!  Bring your epinephrine device with you on days of your injections.   Asthma, mild intermittent  May use Xopenex rescue inhaler 2 puffs or xopenex nebulizer every 4 to 6 hours as needed for shortness of breath, chest tightness, coughing, and wheezing. May use albuterol rescue inhaler 2 puffs 5 to 15 minutes prior to strenuous physical activities. Monitor frequency of use.  Changing from albuterol to xopenex due to history of sulfur/sulfate allergy  singulair as above  Asthma control goals:   Full participation in all desired activities (may need albuterol before activity)  Albuterol use two time or less a week on average (not counting use with activity)  Cough interfering with sleep two time or less a month  Oral steroids no more than once a year  No hospitalizations  Follow up in 6 months or sooner if needed  I appreciate the opportunity to take part in [place] care. Please do not hesitate to contact me with questions.  Sincerely,   [place], MD Allergy/Immunology [facility]

## 2019-10-09 NOTE — Patient Instructions (Addendum)
Allergic rhinitis with conjunctivitis  Continue allegra daily as needed  For nasal congestion/drainage can use Nasacort 2 sprays each nostril daily.  Use for 1-2 weeks at time before stopping once improved.  For itchy/watery/red eyes would recommend use of Pazeo 1 drop each eye daily as needed.  Continue singulair 10mg  daily.   Continue allergen immunotherapy per schedule.  You are now at maintenance dosing (red vial)!  Bring your epinephrine device with you on days of your injections.   Asthma  May use Xopenex rescue inhaler 2 puffs or xopenex nebulizer every 4 to 6 hours as needed for shortness of breath, chest tightness, coughing, and wheezing. May use albuterol rescue inhaler 2 puffs 5 to 15 minutes prior to strenuous physical activities. Monitor frequency of use.  Changing from albuterol to xopenex due to history of sulfur/sulfate allergy  singulair as above  Asthma control goals:   Full participation in all desired activities (may need albuterol before activity)  Albuterol use two time or less a week on average (not counting use with activity)  Cough interfering with sleep two time or less a month  Oral steroids no more than once a year  No hospitalizations  Follow up in 6 months or sooner if needed

## 2019-10-20 NOTE — Progress Notes (Signed)
VIALS EXP 10-19-20 

## 2019-10-21 DIAGNOSIS — J3081 Allergic rhinitis due to animal (cat) (dog) hair and dander: Secondary | ICD-10-CM

## 2019-10-23 ENCOUNTER — Ambulatory Visit (INDEPENDENT_AMBULATORY_CARE_PROVIDER_SITE_OTHER): Payer: Managed Care, Other (non HMO)

## 2019-10-23 DIAGNOSIS — J309 Allergic rhinitis, unspecified: Secondary | ICD-10-CM | POA: Diagnosis not present

## 2019-10-28 ENCOUNTER — Ambulatory Visit (INDEPENDENT_AMBULATORY_CARE_PROVIDER_SITE_OTHER): Payer: Managed Care, Other (non HMO)

## 2019-10-28 DIAGNOSIS — J309 Allergic rhinitis, unspecified: Secondary | ICD-10-CM | POA: Diagnosis not present

## 2019-11-05 ENCOUNTER — Ambulatory Visit (INDEPENDENT_AMBULATORY_CARE_PROVIDER_SITE_OTHER): Payer: BC Managed Care – PPO

## 2019-11-05 DIAGNOSIS — J309 Allergic rhinitis, unspecified: Secondary | ICD-10-CM

## 2019-11-18 ENCOUNTER — Ambulatory Visit (INDEPENDENT_AMBULATORY_CARE_PROVIDER_SITE_OTHER): Payer: Managed Care, Other (non HMO)

## 2019-11-18 DIAGNOSIS — J309 Allergic rhinitis, unspecified: Secondary | ICD-10-CM

## 2019-11-27 ENCOUNTER — Ambulatory Visit (INDEPENDENT_AMBULATORY_CARE_PROVIDER_SITE_OTHER): Payer: Managed Care, Other (non HMO)

## 2019-11-27 DIAGNOSIS — J309 Allergic rhinitis, unspecified: Secondary | ICD-10-CM | POA: Diagnosis not present

## 2019-12-05 ENCOUNTER — Ambulatory Visit (INDEPENDENT_AMBULATORY_CARE_PROVIDER_SITE_OTHER): Payer: Managed Care, Other (non HMO)

## 2019-12-05 DIAGNOSIS — J309 Allergic rhinitis, unspecified: Secondary | ICD-10-CM

## 2019-12-09 ENCOUNTER — Ambulatory Visit (INDEPENDENT_AMBULATORY_CARE_PROVIDER_SITE_OTHER): Payer: Managed Care, Other (non HMO) | Admitting: *Deleted

## 2019-12-09 DIAGNOSIS — J309 Allergic rhinitis, unspecified: Secondary | ICD-10-CM

## 2019-12-16 ENCOUNTER — Ambulatory Visit (INDEPENDENT_AMBULATORY_CARE_PROVIDER_SITE_OTHER): Payer: Managed Care, Other (non HMO)

## 2019-12-16 DIAGNOSIS — J309 Allergic rhinitis, unspecified: Secondary | ICD-10-CM | POA: Diagnosis not present

## 2019-12-23 ENCOUNTER — Ambulatory Visit (INDEPENDENT_AMBULATORY_CARE_PROVIDER_SITE_OTHER): Payer: Managed Care, Other (non HMO)

## 2019-12-23 DIAGNOSIS — J309 Allergic rhinitis, unspecified: Secondary | ICD-10-CM | POA: Diagnosis not present

## 2020-01-08 ENCOUNTER — Ambulatory Visit (INDEPENDENT_AMBULATORY_CARE_PROVIDER_SITE_OTHER): Payer: Managed Care, Other (non HMO)

## 2020-01-08 DIAGNOSIS — J309 Allergic rhinitis, unspecified: Secondary | ICD-10-CM

## 2020-01-15 ENCOUNTER — Ambulatory Visit (INDEPENDENT_AMBULATORY_CARE_PROVIDER_SITE_OTHER): Payer: Managed Care, Other (non HMO)

## 2020-01-15 DIAGNOSIS — J309 Allergic rhinitis, unspecified: Secondary | ICD-10-CM | POA: Diagnosis not present

## 2020-01-30 ENCOUNTER — Ambulatory Visit (INDEPENDENT_AMBULATORY_CARE_PROVIDER_SITE_OTHER): Payer: Managed Care, Other (non HMO)

## 2020-01-30 DIAGNOSIS — J309 Allergic rhinitis, unspecified: Secondary | ICD-10-CM | POA: Diagnosis not present

## 2020-02-11 ENCOUNTER — Ambulatory Visit (INDEPENDENT_AMBULATORY_CARE_PROVIDER_SITE_OTHER): Payer: Managed Care, Other (non HMO)

## 2020-02-11 DIAGNOSIS — J309 Allergic rhinitis, unspecified: Secondary | ICD-10-CM

## 2020-03-02 ENCOUNTER — Ambulatory Visit (INDEPENDENT_AMBULATORY_CARE_PROVIDER_SITE_OTHER): Payer: Managed Care, Other (non HMO)

## 2020-03-02 DIAGNOSIS — J309 Allergic rhinitis, unspecified: Secondary | ICD-10-CM

## 2020-03-22 NOTE — Progress Notes (Signed)
Vials exp 03-22-21 

## 2020-03-24 DIAGNOSIS — J3081 Allergic rhinitis due to animal (cat) (dog) hair and dander: Secondary | ICD-10-CM | POA: Diagnosis not present

## 2020-04-08 ENCOUNTER — Other Ambulatory Visit: Payer: Self-pay | Admitting: Allergy

## 2020-04-09 ENCOUNTER — Other Ambulatory Visit: Payer: Self-pay

## 2020-04-09 MED ORDER — MONTELUKAST SODIUM 10 MG PO TABS: 10.0000 mg | ORAL_TABLET | Freq: Every day | ORAL | 0 refills | Status: DC

## 2020-04-10 ENCOUNTER — Other Ambulatory Visit: Payer: Self-pay | Admitting: Allergy

## 2020-05-08 ENCOUNTER — Other Ambulatory Visit: Payer: Self-pay | Admitting: Allergy

## 2020-05-10 ENCOUNTER — Other Ambulatory Visit: Payer: Self-pay | Admitting: Allergy

## 2020-07-15 ENCOUNTER — Ambulatory Visit: Payer: Self-pay

## 2020-08-08 ENCOUNTER — Other Ambulatory Visit: Payer: Self-pay | Admitting: Allergy
